# Patient Record
Sex: Female | Born: 1980 | Race: Asian | Hispanic: No | Marital: Married | Smoking: Never smoker
Health system: Southern US, Community
[De-identification: ages and names within clinical notes are randomized; demographics above are authoritative.]

## PROBLEM LIST (undated history)

## (undated) DIAGNOSIS — G40909 Epilepsy, unspecified, not intractable, without status epilepticus: Secondary | ICD-10-CM

## (undated) HISTORY — DX: Epilepsy, unspecified, not intractable, without status epilepticus: G40.909

---

## 2006-04-26 DIAGNOSIS — G40909 Epilepsy, unspecified, not intractable, without status epilepticus: Secondary | ICD-10-CM

## 2006-04-26 HISTORY — DX: Epilepsy, unspecified, not intractable, without status epilepticus: G40.909

## 2007-08-11 ENCOUNTER — Emergency Department (HOSPITAL_COMMUNITY): Admission: EM | Admit: 2007-08-11 | Discharge: 2007-08-11 | Payer: Self-pay | Admitting: Emergency Medicine

## 2007-08-31 ENCOUNTER — Ambulatory Visit: Payer: Self-pay | Admitting: Nurse Practitioner

## 2007-08-31 DIAGNOSIS — K644 Residual hemorrhoidal skin tags: Secondary | ICD-10-CM | POA: Insufficient documentation

## 2007-08-31 DIAGNOSIS — R569 Unspecified convulsions: Secondary | ICD-10-CM | POA: Insufficient documentation

## 2007-08-31 LAB — CONVERTED CEMR LAB
Bilirubin Urine: NEGATIVE
Blood in Urine, dipstick: NEGATIVE
Glucose, Urine, Semiquant: NEGATIVE
Protein, U semiquant: NEGATIVE
Urobilinogen, UA: 0.2
pH: 7

## 2007-09-01 LAB — CONVERTED CEMR LAB
BUN: 10 mg/dL (ref 6–23)
CO2: 24 meq/L (ref 19–32)
Creatinine, Ser: 0.62 mg/dL (ref 0.40–1.20)
Eosinophils Absolute: 0.1 10*3/uL (ref 0.0–0.7)
Eosinophils Relative: 1 % (ref 0–5)
Glucose, Bld: 70 mg/dL (ref 70–99)
HCT: 41 % (ref 36.0–46.0)
Hemoglobin: 14.1 g/dL (ref 12.0–15.0)
Lymphocytes Relative: 36 % (ref 12–46)
Lymphs Abs: 2.2 10*3/uL (ref 0.7–4.0)
MCV: 83.8 fL (ref 78.0–100.0)
Monocytes Absolute: 0.5 10*3/uL (ref 0.1–1.0)
Monocytes Relative: 9 % (ref 3–12)
Phenytoin Lvl: 4.7 ug/mL — ABNORMAL LOW (ref 10.0–20.0)
RBC: 4.89 M/uL (ref 3.87–5.11)
Sodium: 140 meq/L (ref 135–145)
TSH: 2.58 microintl units/mL (ref 0.350–5.50)
Total Bilirubin: 0.4 mg/dL (ref 0.3–1.2)
Total Protein: 8 g/dL (ref 6.0–8.3)
WBC: 6 10*3/uL (ref 4.0–10.5)

## 2007-11-20 ENCOUNTER — Emergency Department (HOSPITAL_COMMUNITY): Admission: EM | Admit: 2007-11-20 | Discharge: 2007-11-20 | Payer: Self-pay | Admitting: Emergency Medicine

## 2008-01-02 ENCOUNTER — Ambulatory Visit (HOSPITAL_COMMUNITY): Admission: RE | Admit: 2008-01-02 | Discharge: 2008-01-02 | Payer: Self-pay | Admitting: Obstetrics & Gynecology

## 2008-01-15 ENCOUNTER — Ambulatory Visit: Payer: Self-pay | Admitting: Obstetrics & Gynecology

## 2008-02-05 ENCOUNTER — Encounter: Admission: RE | Admit: 2008-02-05 | Discharge: 2008-02-05 | Payer: Self-pay | Admitting: Obstetrics & Gynecology

## 2008-02-05 ENCOUNTER — Ambulatory Visit: Payer: Self-pay | Admitting: Obstetrics & Gynecology

## 2008-02-05 LAB — CONVERTED CEMR LAB
Hemoglobin: 12.5 g/dL (ref 12.0–15.0)
RDW: 12.9 % (ref 11.5–15.5)
WBC: 6.8 10*3/uL (ref 4.0–10.5)

## 2008-02-26 ENCOUNTER — Ambulatory Visit: Payer: Self-pay | Admitting: Obstetrics & Gynecology

## 2008-03-01 ENCOUNTER — Inpatient Hospital Stay (HOSPITAL_COMMUNITY): Admission: AD | Admit: 2008-03-01 | Discharge: 2008-03-02 | Payer: Self-pay | Admitting: Family Medicine

## 2008-03-01 ENCOUNTER — Ambulatory Visit: Payer: Self-pay | Admitting: Obstetrics and Gynecology

## 2008-03-11 ENCOUNTER — Ambulatory Visit: Payer: Self-pay | Admitting: Obstetrics & Gynecology

## 2008-03-11 LAB — CONVERTED CEMR LAB

## 2008-04-01 ENCOUNTER — Ambulatory Visit: Payer: Self-pay | Admitting: Obstetrics & Gynecology

## 2008-04-15 ENCOUNTER — Encounter: Payer: Self-pay | Admitting: Family

## 2008-04-15 ENCOUNTER — Ambulatory Visit: Payer: Self-pay | Admitting: Obstetrics & Gynecology

## 2008-04-15 LAB — CONVERTED CEMR LAB
AST: 18 units/L (ref 0–37)
Alkaline Phosphatase: 86 units/L (ref 39–117)
BUN: 9 mg/dL (ref 6–23)
Creatinine, Ser: 0.49 mg/dL (ref 0.40–1.20)
Potassium: 4.1 meq/L (ref 3.5–5.3)

## 2008-04-16 ENCOUNTER — Encounter: Payer: Self-pay | Admitting: Obstetrics & Gynecology

## 2008-04-16 LAB — CONVERTED CEMR LAB
Chlamydia, DNA Probe: NEGATIVE
GC Probe Amp, Genital: NEGATIVE

## 2008-04-22 ENCOUNTER — Ambulatory Visit: Payer: Self-pay | Admitting: Family Medicine

## 2008-04-25 ENCOUNTER — Inpatient Hospital Stay (HOSPITAL_COMMUNITY): Admission: AD | Admit: 2008-04-25 | Discharge: 2008-04-25 | Payer: Self-pay | Admitting: Family Medicine

## 2008-04-25 ENCOUNTER — Ambulatory Visit: Payer: Self-pay | Admitting: Obstetrics and Gynecology

## 2008-05-01 ENCOUNTER — Ambulatory Visit: Payer: Self-pay | Admitting: Family Medicine

## 2008-05-01 ENCOUNTER — Inpatient Hospital Stay (HOSPITAL_COMMUNITY): Admission: AD | Admit: 2008-05-01 | Discharge: 2008-05-05 | Payer: Self-pay | Admitting: Obstetrics & Gynecology

## 2008-05-01 ENCOUNTER — Encounter: Payer: Self-pay | Admitting: Family Medicine

## 2008-07-25 ENCOUNTER — Emergency Department (HOSPITAL_COMMUNITY): Admission: EM | Admit: 2008-07-25 | Discharge: 2008-07-25 | Payer: Self-pay | Admitting: Family Medicine

## 2010-03-23 ENCOUNTER — Emergency Department (HOSPITAL_COMMUNITY)
Admission: EM | Admit: 2010-03-23 | Discharge: 2010-03-24 | Payer: Self-pay | Source: Home / Self Care | Admitting: Emergency Medicine

## 2010-05-21 ENCOUNTER — Ambulatory Visit (HOSPITAL_COMMUNITY)
Admission: RE | Admit: 2010-05-21 | Discharge: 2010-05-21 | Payer: Self-pay | Source: Home / Self Care | Attending: Family Medicine | Admitting: Family Medicine

## 2010-05-21 ENCOUNTER — Encounter: Payer: Self-pay | Admitting: Family Medicine

## 2010-05-28 ENCOUNTER — Encounter: Payer: Self-pay | Admitting: Obstetrics & Gynecology

## 2010-05-28 ENCOUNTER — Other Ambulatory Visit: Payer: Self-pay

## 2010-05-28 DIAGNOSIS — O2690 Pregnancy related conditions, unspecified, unspecified trimester: Secondary | ICD-10-CM

## 2010-05-28 DIAGNOSIS — O34219 Maternal care for unspecified type scar from previous cesarean delivery: Secondary | ICD-10-CM

## 2010-05-28 DIAGNOSIS — O0930 Supervision of pregnancy with insufficient antenatal care, unspecified trimester: Secondary | ICD-10-CM

## 2010-05-28 LAB — POCT URINALYSIS DIPSTICK
Ketones, ur: NEGATIVE mg/dL
Protein, ur: NEGATIVE mg/dL
Specific Gravity, Urine: 1.025 (ref 1.005–1.030)
pH: 6.5 (ref 5.0–8.0)

## 2010-06-19 ENCOUNTER — Inpatient Hospital Stay (HOSPITAL_COMMUNITY)
Admission: AD | Admit: 2010-06-19 | Discharge: 2010-06-20 | Disposition: A | Discharge status: Home / Self Care | Payer: Medicaid Other | Source: Ambulatory Visit | Attending: Obstetrics & Gynecology | Admitting: Obstetrics & Gynecology

## 2010-06-19 DIAGNOSIS — B9689 Other specified bacterial agents as the cause of diseases classified elsewhere: Secondary | ICD-10-CM | POA: Insufficient documentation

## 2010-06-19 DIAGNOSIS — O2390 Unspecified genitourinary tract infection in pregnancy, unspecified trimester: Secondary | ICD-10-CM

## 2010-06-19 DIAGNOSIS — R109 Unspecified abdominal pain: Secondary | ICD-10-CM | POA: Insufficient documentation

## 2010-06-19 DIAGNOSIS — N760 Acute vaginitis: Secondary | ICD-10-CM | POA: Insufficient documentation

## 2010-06-19 DIAGNOSIS — A499 Bacterial infection, unspecified: Secondary | ICD-10-CM | POA: Insufficient documentation

## 2010-06-20 LAB — URINALYSIS, ROUTINE W REFLEX MICROSCOPIC
Bilirubin Urine: NEGATIVE
Hgb urine dipstick: NEGATIVE
Ketones, ur: NEGATIVE mg/dL
Urine Glucose, Fasting: NEGATIVE mg/dL
pH: 6.5 (ref 5.0–8.0)

## 2010-06-20 LAB — WET PREP, GENITAL: Yeast Wet Prep HPF POC: NONE SEEN

## 2010-06-20 LAB — URINE MICROSCOPIC-ADD ON

## 2010-06-21 LAB — URINE CULTURE: Culture: NO GROWTH

## 2010-06-25 ENCOUNTER — Other Ambulatory Visit: Payer: Self-pay

## 2010-06-25 ENCOUNTER — Encounter: Payer: Self-pay | Admitting: Family Medicine

## 2010-06-25 DIAGNOSIS — O0930 Supervision of pregnancy with insufficient antenatal care, unspecified trimester: Secondary | ICD-10-CM

## 2010-06-25 DIAGNOSIS — O2690 Pregnancy related conditions, unspecified, unspecified trimester: Secondary | ICD-10-CM

## 2010-06-25 LAB — POCT URINALYSIS DIPSTICK
Bilirubin Urine: NEGATIVE
Ketones, ur: NEGATIVE mg/dL
Protein, ur: NEGATIVE mg/dL

## 2010-07-07 LAB — URINALYSIS, ROUTINE W REFLEX MICROSCOPIC
Ketones, ur: NEGATIVE mg/dL
Nitrite: NEGATIVE
Urobilinogen, UA: 0.2 mg/dL (ref 0.0–1.0)
pH: 7.5 (ref 5.0–8.0)

## 2010-07-17 ENCOUNTER — Inpatient Hospital Stay (INDEPENDENT_AMBULATORY_CARE_PROVIDER_SITE_OTHER)
Admission: RE | Admit: 2010-07-17 | Discharge: 2010-07-17 | Disposition: A | Discharge status: Home / Self Care | Payer: Medicaid Other | Source: Ambulatory Visit | Attending: Family Medicine | Admitting: Family Medicine

## 2010-07-17 DIAGNOSIS — M545 Low back pain: Secondary | ICD-10-CM

## 2010-07-17 LAB — POCT URINALYSIS DIP (DEVICE)
Bilirubin Urine: NEGATIVE
Nitrite: NEGATIVE
Protein, ur: NEGATIVE mg/dL
Urobilinogen, UA: 0.2 mg/dL (ref 0.0–1.0)
pH: 5.5 (ref 5.0–8.0)

## 2010-07-18 LAB — URINE CULTURE
Colony Count: 10000
Culture  Setup Time: 201203231721

## 2010-07-23 ENCOUNTER — Encounter: Payer: Self-pay | Admitting: Family Medicine

## 2010-07-23 ENCOUNTER — Other Ambulatory Visit: Payer: Self-pay | Admitting: Family Medicine

## 2010-07-23 DIAGNOSIS — Z331 Pregnant state, incidental: Secondary | ICD-10-CM

## 2010-07-23 DIAGNOSIS — O2690 Pregnancy related conditions, unspecified, unspecified trimester: Secondary | ICD-10-CM

## 2010-07-23 LAB — POCT URINALYSIS DIP (DEVICE)
Bilirubin Urine: NEGATIVE
Glucose, UA: NEGATIVE mg/dL
Hgb urine dipstick: NEGATIVE
Ketones, ur: NEGATIVE mg/dL
Specific Gravity, Urine: 1.025 (ref 1.005–1.030)

## 2010-07-23 LAB — CONVERTED CEMR LAB
Hemoglobin: 12.7 g/dL (ref 12.0–15.0)
MCHC: 33 g/dL (ref 30.0–36.0)
MCV: 85.4 fL (ref 78.0–100.0)
RBC: 4.51 M/uL (ref 3.87–5.11)
WBC: 7.8 10*3/uL (ref 4.0–10.5)

## 2010-08-10 ENCOUNTER — Other Ambulatory Visit: Payer: Self-pay | Admitting: Family Medicine

## 2010-08-10 DIAGNOSIS — O2690 Pregnancy related conditions, unspecified, unspecified trimester: Secondary | ICD-10-CM

## 2010-08-10 DIAGNOSIS — Z331 Pregnant state, incidental: Secondary | ICD-10-CM

## 2010-08-10 DIAGNOSIS — O0930 Supervision of pregnancy with insufficient antenatal care, unspecified trimester: Secondary | ICD-10-CM

## 2010-08-10 DIAGNOSIS — O34219 Maternal care for unspecified type scar from previous cesarean delivery: Secondary | ICD-10-CM

## 2010-08-10 LAB — POCT URINALYSIS DIP (DEVICE)
Glucose, UA: NEGATIVE mg/dL
Hgb urine dipstick: NEGATIVE
Nitrite: NEGATIVE
Protein, ur: 30 mg/dL — AB
Specific Gravity, Urine: 1.025 (ref 1.005–1.030)
Urobilinogen, UA: 0.2 mg/dL (ref 0.0–1.0)

## 2010-08-10 LAB — CBC
HCT: 27.8 % — ABNORMAL LOW (ref 36.0–46.0)
HCT: 36.1 % (ref 36.0–46.0)
Hemoglobin: 12.1 g/dL (ref 12.0–15.0)
MCHC: 33.6 g/dL (ref 30.0–36.0)
MCHC: 34 g/dL (ref 30.0–36.0)
MCV: 84.6 fL (ref 78.0–100.0)
Platelets: 119 10*3/uL — ABNORMAL LOW (ref 150–400)
RBC: 3.29 MIL/uL — ABNORMAL LOW (ref 3.87–5.11)
RBC: 4.28 MIL/uL (ref 3.87–5.11)
RDW: 14.7 % (ref 11.5–15.5)

## 2010-08-24 ENCOUNTER — Other Ambulatory Visit: Payer: Self-pay | Admitting: Obstetrics & Gynecology

## 2010-08-24 DIAGNOSIS — O0930 Supervision of pregnancy with insufficient antenatal care, unspecified trimester: Secondary | ICD-10-CM

## 2010-08-24 DIAGNOSIS — O34219 Maternal care for unspecified type scar from previous cesarean delivery: Secondary | ICD-10-CM

## 2010-08-24 DIAGNOSIS — O9989 Other specified diseases and conditions complicating pregnancy, childbirth and the puerperium: Secondary | ICD-10-CM

## 2010-08-24 DIAGNOSIS — Z331 Pregnant state, incidental: Secondary | ICD-10-CM

## 2010-08-24 LAB — POCT URINALYSIS DIP (DEVICE)
Bilirubin Urine: NEGATIVE
Hgb urine dipstick: NEGATIVE
Nitrite: NEGATIVE
Urobilinogen, UA: 0.2 mg/dL (ref 0.0–1.0)
pH: 7 (ref 5.0–8.0)

## 2010-09-07 ENCOUNTER — Inpatient Hospital Stay (HOSPITAL_COMMUNITY)
Admission: AD | Admit: 2010-09-07 | Discharge: 2010-09-07 | Disposition: A | Discharge status: Home / Self Care | Payer: Medicaid Other | Source: Ambulatory Visit | Attending: Family Medicine | Admitting: Family Medicine

## 2010-09-07 ENCOUNTER — Other Ambulatory Visit: Payer: Self-pay | Admitting: Obstetrics & Gynecology

## 2010-09-07 DIAGNOSIS — O99350 Diseases of the nervous system complicating pregnancy, unspecified trimester: Secondary | ICD-10-CM

## 2010-09-07 DIAGNOSIS — D689 Coagulation defect, unspecified: Secondary | ICD-10-CM | POA: Insufficient documentation

## 2010-09-07 DIAGNOSIS — O2650 Maternal hypotension syndrome, unspecified trimester: Secondary | ICD-10-CM | POA: Insufficient documentation

## 2010-09-07 DIAGNOSIS — G40909 Epilepsy, unspecified, not intractable, without status epilepticus: Secondary | ICD-10-CM

## 2010-09-07 DIAGNOSIS — O99119 Other diseases of the blood and blood-forming organs and certain disorders involving the immune mechanism complicating pregnancy, unspecified trimester: Secondary | ICD-10-CM

## 2010-09-07 DIAGNOSIS — O34219 Maternal care for unspecified type scar from previous cesarean delivery: Secondary | ICD-10-CM

## 2010-09-07 DIAGNOSIS — O0930 Supervision of pregnancy with insufficient antenatal care, unspecified trimester: Secondary | ICD-10-CM

## 2010-09-07 DIAGNOSIS — D696 Thrombocytopenia, unspecified: Secondary | ICD-10-CM | POA: Insufficient documentation

## 2010-09-07 DIAGNOSIS — O2690 Pregnancy related conditions, unspecified, unspecified trimester: Secondary | ICD-10-CM

## 2010-09-07 DIAGNOSIS — Z331 Pregnant state, incidental: Secondary | ICD-10-CM

## 2010-09-07 LAB — URINALYSIS, ROUTINE W REFLEX MICROSCOPIC
Hgb urine dipstick: NEGATIVE
Nitrite: NEGATIVE
Protein, ur: NEGATIVE mg/dL
Specific Gravity, Urine: >1.03 — ABNORMAL HIGH (ref 1.005–1.030)
Urobilinogen, UA: 1 mg/dL (ref 0.0–1.0)

## 2010-09-07 LAB — POCT URINALYSIS DIP (DEVICE)
Ketones, ur: NEGATIVE mg/dL
Nitrite: NEGATIVE
Protein, ur: 30 mg/dL — AB
Urobilinogen, UA: 0.2 mg/dL (ref 0.0–1.0)
pH: 6 (ref 5.0–8.0)

## 2010-09-07 LAB — URINE MICROSCOPIC-ADD ON

## 2010-09-07 LAB — BASIC METABOLIC PANEL
Chloride: 103 mEq/L (ref 96–112)
Potassium: 3.3 mEq/L — ABNORMAL LOW (ref 3.5–5.1)

## 2010-09-07 LAB — CBC
HCT: 36 % (ref 36.0–46.0)
MCHC: 33.1 g/dL (ref 30.0–36.0)
Platelets: 143 10*3/uL — ABNORMAL LOW (ref 150–400)
RDW: 13.2 % (ref 11.5–15.5)
WBC: 8.8 10*3/uL (ref 4.0–10.5)

## 2010-09-08 NOTE — Op Note (Signed)
NAMEDEANDRIA, KLUTE                ACCOUNT NO.:  [id number]   MEDICAL RECORD NO.:  [id number]          PATIENT TYPE:  INP   LOCATION:  9121                          FACILITY:  [facility]   PHYSICIAN:  Tanya S. Shawnie Pons, M.D.   DATE OF BIRTH:  06/15/1980   DATE OF PROCEDURE:  05/02/2008  DATE OF DISCHARGE:                               OPERATIVE REPORT   PREOPERATIVE DIAGNOSES:  1. Fetal intolerance of labor.  2. Intrauterine pregnancy at term.  3. Spontaneous rupture of membranes greater than 30 hours.  4. Chorioamnionitis.   POSTOPERATIVE DIAGNOSES:  1. Fetal intolerance of labor.  2. Intrauterine pregnancy at term.  3. Spontaneous rupture of membranes greater than 30 hours.  4. Chorioamnionitis.  5. Loose nuchal cord.   PROCEDURE:  Primary low transverse cesarean section.   SURGEON:  Shelbie Proctor. Shawnie Pons, MD   ASSISTANT:  Odie Sera, DO   ANESTHESIA:  Epidural and local.   REASON FOR PROCEDURE:  Ms. Brossard is a 30 year old gravida 1, now para  1, who presented at 8 weeks' gestational age with spontaneous rupture  of membranes.  She initially was 1-cm dilated and her labor progressed  quite slowly over a period of 30 hours.  The patient dilated to 4 cm.  However, was inadequately contracting.  Despite the best efforts to get  her to be adequately contracting, the baby had recurrent late  decelerations on the fetal heart tracing.  Multiple attempts were made  to try to progress to labor; however, the nonreassuring fetal heart  tracing persisted.  The patient was then counseled on the risks and  benefits of cesarean section to include but not limited to bleeding,  infection, damage to internal organs which may require further surgery.  The patient was counseled by using a language line.  The patient voiced  understanding of the risks and desired to proceed for cesarean section.   DESCRIPTION OF PROCEDURE:  The patient was taken to the operating room  where her epidural was redosed.   The patient was then prepped and draped  in usual sterile manner.  A time-out was conducted.  The appropriate  anesthesia was confirmed.  The patient was placed in the left dorsal  supine position.  A Pfannenstiel incision was made in the abdomen and  extended down to the fascial layer.  The fascia was incised in the  midline.  The fascial incision was extended using manual traction.  The  rectus muscles were separated manually and the peritoneum was entered  bluntly with the finger.  The peritoneal opening was then extended again  with manual traction.  Appropriate open to the uterus was obtained.  The  bladder blade was placed.  A transverse incision was made in the lower  uterine segment with a scalpel and continued through the myometrial  layers.  The final layer was entered bluntly with the finger.  The  uterine incision was then extended laterally with manual traction.  The  fetus was found to be in occiput transverse position.  The fetal head  was grasped.  The bladder blade was removed.  The fetal head was  delivered through the uterine incision with the assistance of fundal  pressure.  The mouth and nares were bulb suctioned.  Then, the shoulders  were delivered followed by the rest of the corpus.  The mouth and nares  were bulb suctioned again.  The cord was then clamped and cut and the  baby was handed to the awaiting NICU staff.  The baby did have  spontaneous cry and good tone.  The baby's Apgars were 8 and 8.  Weight  6 pounds and 7 ounces.  The placenta then delivered intact with the  assistance of fundal massage.  A cord blood gas was collected which had  a pH of 7.34.  The uterus was then cleared of debris and clots using a  dry lap sponge.  The uterine incision was closed using a 2-layer closure  with 3-0 Vicryl in the usual manner.  Good hemostasis was noted.  The  fascia was then closed using 0 Vicryl in a running noninterlocking  fashion.  The subcutaneous tissues were  irrigated with a moist lap  sponge.  Good hemostasis was noted.  There were no defects noted in the  fascial closure.  The skin was then closed using 4-0 Vicryl in a  subcuticular manner.  Steri-Strips were applied.  The upper and lower  aspects of the incision were then injected with 10 mL each of 0.25%  Marcaine without epinephrine.  The same solution was injected 5 mL at  each anterior superior iliac spine.   FINDINGS:  Viable female infant with Apgars 8 and 8, weight 6 pounds 7  ounces and a cord pH of 7.34.   SPECIMENS:  Placenta.   DISPOSITION:  To Labor and Delivery.   ESTIMATED BLOOD LOSS:  800 mL.   No immediate complications.  The patient was taken to PACU in good  condition.      Odie Sera, DO  Electronically Signed     ______________________________  Shelbie Proctor. Shawnie Pons, M.D.    MC/MEDQ  D:  05/02/2008  T:  05/03/2008  Job:  [id number]

## 2010-09-08 NOTE — Discharge Summary (Signed)
NAMESUMIE, REMSEN                ACCOUNT NO.:  [id number]   MEDICAL RECORD NO.:  [id number]          PATIENT TYPE:  INP   LOCATION:  9121                          [facility]ILITY[facility]  [facility]   PHYSICIAN:  Tanya S. Shawnie Pons, M.D.   DATE OF BIRTH:  1981-01-30   DATE OF ADMISSION:  05/01/2008  DATE OF DISCHARGE:  05/05/2008                               DISCHARGE SUMMARY   REASON FOR ADMISSION:  Pregnancy at term and labor.   DISCHARGE DIAGNOSES:  Pregnancy at term, delivered via cesarean section  secondary to fetal intolerance of labor and chorioamnionitis.   POSTOPERATIVE DIAGNOSES:  Pregnancy at term, delivered via cesarean  section secondary to fetal intolerance of labor and chorioamnionitis.   PROCEDURE:  The patient had a first-degree low transverse cesarean  section by Dr. Shawnie Pons and Dr. Noel Gerold, had a viable female infant with  Apgars of 8 and 8, birth weight was 6 pounds 7 ounces and a cord pH of  7.34.  The patient has progressed well during her postop.  She is taking  p.o. fluids and solids well.  She is up ambulating.  She is passing gas  rectally.  Her vital signs have remained stable.  Her hemoglobin today  is 9.4 and hematocrit 28.   PHYSICAL EXAMINATION:  VITAL SIGNS:  Today, vital signs are stable.  HEART:  Regular to rhythm and rate.  LUNGS:  Clear to auscultation bilaterally.  ABDOMEN:  Soft.  Bowel sounds are present in all 4 quadrants.  Fundus is  firm at -2 U.  Incision is intact.  There is no redness, swelling, or  drainage.  Lochia, scant amount.  EXTREMITIES:  There is no edema in the lower extremities.   MEDICATIONS:  Discharge medications are as follows:  1. Continue her prenatal vitamins, Chromagen Forte 1 p.o. b.i.d.  2. Motrin 600 p.o. q.6 h. p.r.n. cramping.  3. Percocet 5/325 one p.o. q.4 h. p.r.n. pain.   She is going to use Depo-Provera as her contraceptive.  She will get  that 150 mg IM prior to discharge.  She notes if she has any problems,  she should call  back to the hospital or come back to [facility]U.      Zerita Boers, N.M.      Shelbie Proctor. Shawnie Pons, M.D.  Electronically Signed    DL/MEDQ  D:  16/01/9603  T:  05/06/2008  Job:  [id number]   cc:   Shelbie Proctor. Shawnie Pons, M.D.

## 2010-09-14 ENCOUNTER — Other Ambulatory Visit: Payer: Self-pay | Admitting: Family Medicine

## 2010-09-14 ENCOUNTER — Other Ambulatory Visit: Payer: Self-pay | Admitting: Obstetrics and Gynecology

## 2010-09-14 DIAGNOSIS — O0930 Supervision of pregnancy with insufficient antenatal care, unspecified trimester: Secondary | ICD-10-CM

## 2010-09-14 DIAGNOSIS — O34219 Maternal care for unspecified type scar from previous cesarean delivery: Secondary | ICD-10-CM

## 2010-09-14 DIAGNOSIS — O2690 Pregnancy related conditions, unspecified, unspecified trimester: Secondary | ICD-10-CM

## 2010-09-14 DIAGNOSIS — Z331 Pregnant state, incidental: Secondary | ICD-10-CM

## 2010-09-14 LAB — POCT URINALYSIS DIP (DEVICE)
Bilirubin Urine: NEGATIVE
Glucose, UA: NEGATIVE mg/dL
Hgb urine dipstick: NEGATIVE
Specific Gravity, Urine: >=1.03 (ref 1.005–1.030)

## 2010-09-17 ENCOUNTER — Ambulatory Visit (HOSPITAL_COMMUNITY)
Admission: RE | Admit: 2010-09-17 | Discharge: 2010-09-17 | Disposition: A | Discharge status: Home / Self Care | Payer: Medicaid Other | Source: Ambulatory Visit | Attending: Family Medicine | Admitting: Family Medicine

## 2010-09-17 DIAGNOSIS — Z3689 Encounter for other specified antenatal screening: Secondary | ICD-10-CM | POA: Insufficient documentation

## 2010-09-17 DIAGNOSIS — O365990 Maternal care for other known or suspected poor fetal growth, unspecified trimester, not applicable or unspecified: Secondary | ICD-10-CM | POA: Insufficient documentation

## 2010-09-21 ENCOUNTER — Inpatient Hospital Stay (HOSPITAL_COMMUNITY)
Admission: AD | Admit: 2010-09-21 | Discharge: 2010-09-21 | Disposition: A | Discharge status: Home / Self Care | Payer: Medicaid Other | Source: Ambulatory Visit | Attending: Obstetrics and Gynecology | Admitting: Obstetrics and Gynecology

## 2010-09-21 DIAGNOSIS — K219 Gastro-esophageal reflux disease without esophagitis: Secondary | ICD-10-CM | POA: Insufficient documentation

## 2010-09-21 DIAGNOSIS — R109 Unspecified abdominal pain: Secondary | ICD-10-CM | POA: Insufficient documentation

## 2010-09-21 DIAGNOSIS — O99891 Other specified diseases and conditions complicating pregnancy: Secondary | ICD-10-CM | POA: Insufficient documentation

## 2010-09-21 DIAGNOSIS — O9989 Other specified diseases and conditions complicating pregnancy, childbirth and the puerperium: Secondary | ICD-10-CM

## 2010-09-24 ENCOUNTER — Other Ambulatory Visit: Payer: Self-pay | Admitting: Obstetrics and Gynecology

## 2010-09-24 DIAGNOSIS — O0930 Supervision of pregnancy with insufficient antenatal care, unspecified trimester: Secondary | ICD-10-CM

## 2010-09-24 DIAGNOSIS — O34219 Maternal care for unspecified type scar from previous cesarean delivery: Secondary | ICD-10-CM

## 2010-09-24 DIAGNOSIS — O2690 Pregnancy related conditions, unspecified, unspecified trimester: Secondary | ICD-10-CM

## 2010-09-24 LAB — POCT URINALYSIS DIP (DEVICE)
Bilirubin Urine: NEGATIVE
Hgb urine dipstick: NEGATIVE
Ketones, ur: NEGATIVE mg/dL
Specific Gravity, Urine: 1.02 (ref 1.005–1.030)
pH: 7 (ref 5.0–8.0)

## 2010-09-28 ENCOUNTER — Other Ambulatory Visit: Payer: Self-pay | Admitting: Obstetrics & Gynecology

## 2010-09-28 DIAGNOSIS — Z331 Pregnant state, incidental: Secondary | ICD-10-CM

## 2010-09-28 DIAGNOSIS — O2690 Pregnancy related conditions, unspecified, unspecified trimester: Secondary | ICD-10-CM

## 2010-09-28 LAB — POCT URINALYSIS DIP (DEVICE)
Ketones, ur: NEGATIVE mg/dL
Protein, ur: NEGATIVE mg/dL
Specific Gravity, Urine: 1.025 (ref 1.005–1.030)
pH: 5.5 (ref 5.0–8.0)

## 2010-10-05 ENCOUNTER — Other Ambulatory Visit: Payer: Self-pay | Admitting: Obstetrics & Gynecology

## 2010-10-05 DIAGNOSIS — O0930 Supervision of pregnancy with insufficient antenatal care, unspecified trimester: Secondary | ICD-10-CM

## 2010-10-05 DIAGNOSIS — O2690 Pregnancy related conditions, unspecified, unspecified trimester: Secondary | ICD-10-CM

## 2010-10-05 DIAGNOSIS — O34219 Maternal care for unspecified type scar from previous cesarean delivery: Secondary | ICD-10-CM

## 2010-10-05 LAB — POCT URINALYSIS DIP (DEVICE)
Glucose, UA: NEGATIVE mg/dL
Hgb urine dipstick: NEGATIVE
Specific Gravity, Urine: 1.025 (ref 1.005–1.030)
Urobilinogen, UA: 0.2 mg/dL (ref 0.0–1.0)

## 2010-10-12 ENCOUNTER — Inpatient Hospital Stay (HOSPITAL_COMMUNITY)
Admission: AD | Admit: 2010-10-12 | Discharge: 2010-10-12 | Disposition: A | Discharge status: Home / Self Care | Payer: Medicaid Other | Source: Ambulatory Visit | Attending: Family Medicine | Admitting: Family Medicine

## 2010-10-12 ENCOUNTER — Inpatient Hospital Stay (HOSPITAL_COMMUNITY)
Admission: AD | Admit: 2010-10-12 | Discharge: 2010-10-12 | Disposition: A | Discharge status: Home / Self Care | Payer: Medicaid Other | Source: Ambulatory Visit | Attending: Obstetrics and Gynecology | Admitting: Obstetrics and Gynecology

## 2010-10-12 DIAGNOSIS — O479 False labor, unspecified: Secondary | ICD-10-CM | POA: Insufficient documentation

## 2010-10-12 LAB — URINALYSIS, ROUTINE W REFLEX MICROSCOPIC
Glucose, UA: NEGATIVE mg/dL
Hgb urine dipstick: NEGATIVE
Protein, ur: 30 mg/dL — AB

## 2010-10-12 LAB — URINE MICROSCOPIC-ADD ON

## 2010-10-12 LAB — AMNISURE RUPTURE OF MEMBRANE (ROM) NOT AT ARMC: Amnisure ROM: NEGATIVE

## 2010-10-13 ENCOUNTER — Inpatient Hospital Stay (HOSPITAL_COMMUNITY)
Admission: AD | Admit: 2010-10-13 | Discharge: 2010-10-15 | DRG: 775 | Disposition: A | Discharge status: Home / Self Care | Payer: Medicaid Other | Source: Ambulatory Visit | Attending: Family Medicine | Admitting: Family Medicine

## 2010-10-13 LAB — CBC
MCV: 81.1 fL (ref 78.0–100.0)
Platelets: 126 10*3/uL — ABNORMAL LOW (ref 150–400)
RBC: 4.44 MIL/uL (ref 3.87–5.11)
WBC: 13.8 10*3/uL — ABNORMAL HIGH (ref 4.0–10.5)

## 2010-10-13 LAB — RPR: RPR Ser Ql: NONREACTIVE

## 2010-10-20 ENCOUNTER — Inpatient Hospital Stay (HOSPITAL_COMMUNITY): Admission: AD | Admit: 2010-10-20 | Payer: Self-pay | Source: Home / Self Care | Admitting: Obstetrics & Gynecology

## 2011-01-25 LAB — POCT URINALYSIS DIP (DEVICE)
Bilirubin Urine: NEGATIVE
Glucose, UA: NEGATIVE
Nitrite: NEGATIVE
Operator id: 194561

## 2011-01-26 LAB — POCT URINALYSIS DIP (DEVICE)
Bilirubin Urine: NEGATIVE
Bilirubin Urine: NEGATIVE
Glucose, UA: NEGATIVE
Glucose, UA: NEGATIVE
Glucose, UA: NEGATIVE
Nitrite: NEGATIVE
Nitrite: NEGATIVE
Operator id: 194561
Operator id: 287931
Protein, ur: 100 — AB
Specific Gravity, Urine: 1.025
Urobilinogen, UA: 0.2
Urobilinogen, UA: 0.2
Urobilinogen, UA: 0.2

## 2011-01-26 LAB — CBC
MCHC: 34.4
Platelets: 165
RBC: 4.1
WBC: 9.1

## 2011-01-29 LAB — POCT URINALYSIS DIP (DEVICE)
Bilirubin Urine: NEGATIVE
Bilirubin Urine: NEGATIVE
Bilirubin Urine: NEGATIVE
Glucose, UA: 100 mg/dL — AB
Glucose, UA: NEGATIVE mg/dL
Glucose, UA: NEGATIVE mg/dL
Hgb urine dipstick: NEGATIVE
Hgb urine dipstick: NEGATIVE
Ketones, ur: NEGATIVE mg/dL
Nitrite: NEGATIVE
Nitrite: NEGATIVE
Nitrite: NEGATIVE
Urobilinogen, UA: 0.2 mg/dL (ref 0.0–1.0)
Urobilinogen, UA: 0.2 mg/dL (ref 0.0–1.0)
pH: 5.5 (ref 5.0–8.0)

## 2011-01-29 LAB — GLUCOSE, CAPILLARY: Glucose-Capillary: 98 mg/dL (ref 70–99)

## 2014-12-22 ENCOUNTER — Inpatient Hospital Stay (HOSPITAL_COMMUNITY)
Admission: EM | Admit: 2014-12-22 | Discharge: 2014-12-26 | DRG: 917 | Disposition: A | Discharge status: Home / Self Care | Payer: BLUE CROSS/BLUE SHIELD | Attending: Internal Medicine | Admitting: Internal Medicine

## 2014-12-22 ENCOUNTER — Inpatient Hospital Stay (HOSPITAL_COMMUNITY): Payer: BLUE CROSS/BLUE SHIELD

## 2014-12-22 ENCOUNTER — Emergency Department (HOSPITAL_COMMUNITY): Payer: BLUE CROSS/BLUE SHIELD

## 2014-12-22 ENCOUNTER — Encounter (HOSPITAL_COMMUNITY): Payer: Self-pay | Admitting: Emergency Medicine

## 2014-12-22 DIAGNOSIS — R0682 Tachypnea, not elsewhere classified: Secondary | ICD-10-CM | POA: Diagnosis present

## 2014-12-22 DIAGNOSIS — A419 Sepsis, unspecified organism: Secondary | ICD-10-CM | POA: Diagnosis present

## 2014-12-22 DIAGNOSIS — T1491 Suicide attempt: Secondary | ICD-10-CM | POA: Diagnosis not present

## 2014-12-22 DIAGNOSIS — T5491XA Toxic effect of unspecified corrosive substance, accidental (unintentional), initial encounter: Secondary | ICD-10-CM | POA: Diagnosis not present

## 2014-12-22 DIAGNOSIS — T600X2A Toxic effect of organophosphate and carbamate insecticides, intentional self-harm, initial encounter: Secondary | ICD-10-CM | POA: Diagnosis not present

## 2014-12-22 DIAGNOSIS — T6092XA Toxic effect of unspecified pesticide, intentional self-harm, initial encounter: Principal | ICD-10-CM | POA: Diagnosis present

## 2014-12-22 DIAGNOSIS — E876 Hypokalemia: Secondary | ICD-10-CM | POA: Diagnosis present

## 2014-12-22 DIAGNOSIS — E872 Acidosis, unspecified: Secondary | ICD-10-CM

## 2014-12-22 DIAGNOSIS — G40909 Epilepsy, unspecified, not intractable, without status epilepticus: Secondary | ICD-10-CM | POA: Diagnosis present

## 2014-12-22 DIAGNOSIS — G9340 Encephalopathy, unspecified: Secondary | ICD-10-CM

## 2014-12-22 DIAGNOSIS — K2900 Acute gastritis without bleeding: Secondary | ICD-10-CM | POA: Diagnosis present

## 2014-12-22 DIAGNOSIS — F329 Major depressive disorder, single episode, unspecified: Secondary | ICD-10-CM | POA: Diagnosis present

## 2014-12-22 DIAGNOSIS — R680 Hypothermia, not associated with low environmental temperature: Secondary | ICD-10-CM | POA: Diagnosis present

## 2014-12-22 DIAGNOSIS — R569 Unspecified convulsions: Secondary | ICD-10-CM | POA: Diagnosis not present

## 2014-12-22 DIAGNOSIS — Y92009 Unspecified place in unspecified non-institutional (private) residence as the place of occurrence of the external cause: Secondary | ICD-10-CM | POA: Diagnosis not present

## 2014-12-22 DIAGNOSIS — I959 Hypotension, unspecified: Secondary | ICD-10-CM | POA: Diagnosis present

## 2014-12-22 DIAGNOSIS — R0902 Hypoxemia: Secondary | ICD-10-CM

## 2014-12-22 DIAGNOSIS — T5494XA Toxic effect of unspecified corrosive substance, undetermined, initial encounter: Secondary | ICD-10-CM

## 2014-12-22 DIAGNOSIS — I9589 Other hypotension: Secondary | ICD-10-CM | POA: Diagnosis not present

## 2014-12-22 DIAGNOSIS — R6510 Systemic inflammatory response syndrome (SIRS) of non-infectious origin without acute organ dysfunction: Secondary | ICD-10-CM

## 2014-12-22 DIAGNOSIS — T1491XA Suicide attempt, initial encounter: Secondary | ICD-10-CM | POA: Diagnosis present

## 2014-12-22 DIAGNOSIS — R4182 Altered mental status, unspecified: Secondary | ICD-10-CM | POA: Diagnosis present

## 2014-12-22 LAB — URINALYSIS, ROUTINE W REFLEX MICROSCOPIC
BILIRUBIN URINE: NEGATIVE
GLUCOSE, UA: 250 mg/dL — AB
HGB URINE DIPSTICK: NEGATIVE
KETONES UR: NEGATIVE mg/dL
LEUKOCYTES UA: NEGATIVE
Nitrite: NEGATIVE
PROTEIN: NEGATIVE mg/dL
Specific Gravity, Urine: 1.021 (ref 1.005–1.030)
Urobilinogen, UA: 0.2 mg/dL (ref 0.0–1.0)
pH: 5 (ref 5.0–8.0)

## 2014-12-22 LAB — COMPREHENSIVE METABOLIC PANEL
ALT: 50 U/L (ref 14–54)
AST: 42 U/L — AB (ref 15–41)
Albumin: 5.1 g/dL — ABNORMAL HIGH (ref 3.5–5.0)
Alkaline Phosphatase: 68 U/L (ref 38–126)
Anion gap: 19 — ABNORMAL HIGH (ref 5–15)
BILIRUBIN TOTAL: 1.3 mg/dL — AB (ref 0.3–1.2)
BUN: 15 mg/dL (ref 6–20)
CO2: 17 mmol/L — ABNORMAL LOW (ref 22–32)
Calcium: 9.5 mg/dL (ref 8.9–10.3)
Chloride: 106 mmol/L (ref 101–111)
Creatinine, Ser: 0.73 mg/dL (ref 0.44–1.00)
GFR calc Af Amer: >60 mL/min (ref >60)
Glucose, Bld: 207 mg/dL — ABNORMAL HIGH (ref 65–99)
POTASSIUM: 2.6 mmol/L — AB (ref 3.5–5.1)
Sodium: 142 mmol/L (ref 135–145)
TOTAL PROTEIN: 8.6 g/dL — AB (ref 6.5–8.1)

## 2014-12-22 LAB — BLOOD GAS, ARTERIAL
ACID-BASE DEFICIT: 6.5 mmol/L — AB (ref 0.0–2.0)
Acid-base deficit: 6.5 mmol/L — ABNORMAL HIGH (ref 0.0–2.0)
BICARBONATE: 16.1 meq/L — AB (ref 20.0–24.0)
Bicarbonate: 15.4 mEq/L — ABNORMAL LOW (ref 20.0–24.0)
DRAWN BY: 295031
FIO2: 0.21
FIO2: 0.21
O2 SAT: 97.8 %
O2 SAT: 98.7 %
PATIENT TEMPERATURE: 98.6
PATIENT TEMPERATURE: 98.6
PH ART: 7.416 (ref 7.350–7.450)
PO2 ART: 176 mmHg — AB (ref 80.0–100.0)
TCO2: 14.2 mmol/L (ref 0–100)
TCO2: 13.6 mmol/L (ref 0–100)
pCO2 arterial: 25.6 mmHg — ABNORMAL LOW (ref 35.0–45.0)
pCO2 arterial: 22.6 mmHg — ABNORMAL LOW (ref 35.0–45.0)
pH, Arterial: 7.449 (ref 7.350–7.450)
pO2, Arterial: 119 mmHg — ABNORMAL HIGH (ref 80.0–100.0)

## 2014-12-22 LAB — PROCALCITONIN: PROCALCITONIN: 0.6 ng/mL

## 2014-12-22 LAB — CBC WITH DIFFERENTIAL/PLATELET
BASOS PCT: 0 % (ref 0–1)
Basophils Absolute: 0 10*3/uL (ref 0.0–0.1)
EOS PCT: 0 % (ref 0–5)
Eosinophils Absolute: 0 10*3/uL (ref 0.0–0.7)
HEMATOCRIT: 43.4 % (ref 36.0–46.0)
Hemoglobin: 15 g/dL (ref 12.0–15.0)
LYMPHS ABS: 3.8 10*3/uL (ref 0.7–4.0)
Lymphocytes Relative: 12 % (ref 12–46)
MCH: 28.5 pg (ref 26.0–34.0)
MCHC: 34.6 g/dL (ref 30.0–36.0)
MCV: 82.4 fL (ref 78.0–100.0)
MONOS PCT: 3 % (ref 3–12)
Monocytes Absolute: 0.9 10*3/uL (ref 0.1–1.0)
NEUTROS ABS: 26.6 10*3/uL — AB (ref 1.7–7.7)
Neutrophils Relative %: 85 % — ABNORMAL HIGH (ref 43–77)
Platelets: 163 10*3/uL (ref 150–400)
RBC: 5.27 MIL/uL — AB (ref 3.87–5.11)
RDW: 13.2 % (ref 11.5–15.5)
WBC: 31.3 10*3/uL — ABNORMAL HIGH (ref 4.0–10.5)

## 2014-12-22 LAB — ACETAMINOPHEN LEVEL: Acetaminophen (Tylenol), Serum: <10 ug/mL — ABNORMAL LOW (ref 10–30)

## 2014-12-22 LAB — CK TOTAL AND CKMB (NOT AT ARMC)
CK TOTAL: 62 U/L (ref 38–234)
CK, MB: 6.1 ng/mL — AB (ref 0.5–5.0)
RELATIVE INDEX: INVALID (ref 0.0–2.5)

## 2014-12-22 LAB — RAPID URINE DRUG SCREEN, HOSP PERFORMED
AMPHETAMINES: NOT DETECTED
BENZODIAZEPINES: NOT DETECTED
Barbiturates: NOT DETECTED
Cocaine: NOT DETECTED
OPIATES: NOT DETECTED
Tetrahydrocannabinol: NOT DETECTED

## 2014-12-22 LAB — I-STAT CG4 LACTIC ACID, ED: LACTIC ACID, VENOUS: 4.93 mmol/L — AB (ref 0.5–2.0)

## 2014-12-22 LAB — CORTISOL: Cortisol, Plasma: 39.5 ug/dL

## 2014-12-22 LAB — SALICYLATE LEVEL: Salicylate Lvl: <4 mg/dL (ref 2.8–30.0)

## 2014-12-22 LAB — AMYLASE
AMYLASE: 40 U/L (ref 28–100)
Amylase: 30 U/L (ref 28–100)

## 2014-12-22 LAB — MRSA PCR SCREENING: MRSA BY PCR: NEGATIVE

## 2014-12-22 LAB — LACTIC ACID, PLASMA
LACTIC ACID, VENOUS: 6.8 mmol/L — AB (ref 0.5–2.0)
LACTIC ACID, VENOUS: 1.4 mmol/L (ref 0.5–2.0)

## 2014-12-22 LAB — TROPONIN I: Troponin I: <0.03 ng/mL (ref <0.031)

## 2014-12-22 LAB — C DIFFICILE QUICK SCREEN W PCR REFLEX
C DIFFICILE (CDIFF) INTERP: NEGATIVE
C Diff antigen: NEGATIVE
C Diff toxin: NEGATIVE

## 2014-12-22 LAB — CBG MONITORING, ED: Glucose-Capillary: 187 mg/dL — ABNORMAL HIGH (ref 65–99)

## 2014-12-22 LAB — APTT: aPTT: 29 seconds (ref 24–37)

## 2014-12-22 LAB — PHOSPHORUS
Phosphorus: 2.5 mg/dL (ref 2.5–4.6)
Phosphorus: 1.4 mg/dL — ABNORMAL LOW (ref 2.5–4.6)

## 2014-12-22 LAB — STREP PNEUMONIAE URINARY ANTIGEN: Strep Pneumo Urinary Antigen: NEGATIVE

## 2014-12-22 LAB — PROTIME-INR
INR: 1.03 (ref 0.00–1.49)
Prothrombin Time: 13.7 seconds (ref 11.6–15.2)

## 2014-12-22 LAB — ETHANOL: Alcohol, Ethyl (B): <5 mg/dL (ref <5)

## 2014-12-22 LAB — POC URINE PREG, ED: Preg Test, Ur: NEGATIVE

## 2014-12-22 LAB — LIPASE, BLOOD
LIPASE: 17 U/L — AB (ref 22–51)
Lipase: 21 U/L — ABNORMAL LOW (ref 22–51)

## 2014-12-22 LAB — MAGNESIUM
MAGNESIUM: 2 mg/dL (ref 1.7–2.4)
Magnesium: 1.5 mg/dL — ABNORMAL LOW (ref 1.7–2.4)

## 2014-12-22 MED ORDER — ATROPINE SULFATE 1 MG/ML IJ SOLN
2.0000 mg | Freq: Once | INTRAMUSCULAR | Status: AC
  Administered 2014-12-22: 1 mg via INTRAVENOUS
  Filled 2014-12-22: qty 2

## 2014-12-22 MED ORDER — SODIUM CHLORIDE 0.9 % IV BOLUS (SEPSIS)
1000.0000 mL | Freq: Once | INTRAVENOUS | Status: AC
  Administered 2014-12-22: 1000 mL via INTRAVENOUS

## 2014-12-22 MED ORDER — PANTOPRAZOLE SODIUM 40 MG IV SOLR
40.0000 mg | Freq: Every day | INTRAVENOUS | Status: DC
  Administered 2014-12-22 – 2014-12-24 (×3): 40 mg via INTRAVENOUS
  Filled 2014-12-22 (×3): qty 40

## 2014-12-22 MED ORDER — SODIUM CHLORIDE 0.9 % IV SOLN
INTRAVENOUS | Status: DC
  Administered 2014-12-22: 19:00:00 via INTRAVENOUS

## 2014-12-22 MED ORDER — LEVETIRACETAM 500 MG/5ML IV SOLN
500.0000 mg | Freq: Two times a day (BID) | INTRAVENOUS | Status: DC
  Administered 2014-12-23 – 2014-12-25 (×5): 500 mg via INTRAVENOUS
  Filled 2014-12-22 (×7): qty 5

## 2014-12-22 MED ORDER — POTASSIUM CHLORIDE 10 MEQ/100ML IV SOLN
10.0000 meq | INTRAVENOUS | Status: AC
  Administered 2014-12-22 (×4): 10 meq via INTRAVENOUS
  Filled 2014-12-22 (×3): qty 100

## 2014-12-22 MED ORDER — SODIUM CHLORIDE 0.9 % IV SOLN
1.0000 g | Freq: Once | INTRAVENOUS | Status: AC
  Administered 2014-12-22: 1 g via INTRAVENOUS
  Filled 2014-12-22: qty 1

## 2014-12-22 MED ORDER — SODIUM CHLORIDE 0.9 % IV SOLN
3.0000 g | Freq: Four times a day (QID) | INTRAVENOUS | Status: DC
  Administered 2014-12-22 – 2014-12-24 (×7): 3 g via INTRAVENOUS
  Filled 2014-12-22 (×9): qty 3

## 2014-12-22 MED ORDER — SODIUM CHLORIDE 0.9 % IV SOLN
1000.0000 mg | Freq: Once | INTRAVENOUS | Status: AC
  Administered 2014-12-22: 1000 mg via INTRAVENOUS
  Filled 2014-12-22: qty 10

## 2014-12-22 MED ORDER — SODIUM CHLORIDE 0.9 % IV BOLUS (SEPSIS)
2000.0000 mL | Freq: Once | INTRAVENOUS | Status: AC
  Administered 2014-12-22: 2000 mL via INTRAVENOUS

## 2014-12-22 MED ORDER — SODIUM CHLORIDE 0.9 % IV SOLN
INTRAVENOUS | Status: DC
  Administered 2014-12-22 – 2014-12-25 (×8): via INTRAVENOUS
  Filled 2014-12-22 (×21): qty 250

## 2014-12-22 MED ORDER — SODIUM CHLORIDE 0.9 % IV SOLN: 1000.0000 mg | Freq: Two times a day (BID) | INTRAVENOUS | Status: DC

## 2014-12-22 MED ORDER — LIP MEDEX EX OINT
TOPICAL_OINTMENT | CUTANEOUS | Status: AC
  Administered 2014-12-22: 1
  Filled 2014-12-22: qty 7

## 2014-12-22 NOTE — ED Notes (Signed)
RT at bedside to obtain ABG.

## 2014-12-22 NOTE — Procedures (Signed)
Central Venous Catheter Insertion Procedure Note Roberta Hoover [id number] [birth year]  Procedure: Insertion of Central Venous Catheter Indications: Assessment of intravascular volume, Drug and/or fluid administration and Frequent blood sampling  Procedure Details Consent: Risks of procedure as well as the alternatives and risks of each were explained to the (patient/caregiver).  Consent for procedure obtained. and Unable to obtain consent because of altered level of consciousness. Time Out: Verified patient identification, verified procedure, site/side was marked, verified correct patient position, special equipment/implants available, medications/allergies/relevent history reviewed, required imaging and test results available.  Performed  Maximum sterile technique was used including antiseptics, cap, gloves, gown, hand hygiene, mask and sheet. Skin prep: Chlorhexidine; local anesthetic administered A antimicrobial bonded/coated triple lumen catheter was placed in the left internal jugular vein using the Seldinger technique. Ultrasound guidance used.Yes.   Catheter placed to 20 cm. Blood aspirated via all 3 ports and then flushed x 3. Line sutured x 2 and dressing applied.  Evaluation Blood flow good Complications: No apparent complications Patient did tolerate procedure well. Chest X-ray ordered to verify placement.  CXR: pending.  Brett Canales Linc Renne ACNP [facility] PCCM Pager [phone] till 3 pm If no answer page [phone] 12/22/2014, 4:53 PM

## 2014-12-22 NOTE — Progress Notes (Signed)
MEDICATION RELATED CONSULT NOTE - INITIAL   Pharmacy Consult for pralidoxime Indication: s/p ingestion acephate, an organophosphate  Allergies not on file  Patient Measurements:     Vital Signs: Temp: 97.7 F (36.5 C) (08/28 2000) Temp Source: Core (Comment) (08/28 2000) BP: 90/71 mmHg (08/28 2000) Pulse Rate: 115 (08/28 2000) I Labs:  Recent Labs  12/22/14 1403 12/22/14 1601 12/22/14 1830  WBC 31.3*  --   --   HGB 15.0  --   --   HCT 43.4  --   --   PLT 163  --   --   APTT  --  29  --   CREATININE 0.73  --   --   MG  --  2.0 1.5*  PHOS  --  2.5 1.4*  ALBUMIN 5.1*  --   --   PROT 8.6*  --   --   AST 42*  --   --   ALT 50  --   --   ALKPHOS 68  --   --   BILITOT 1.3*  --   --    CrCl cannot be calculated (Unknown ideal weight.).  Medical History: History reviewed. No pertinent past medical history.  Medications:  Scheduled:  . ampicillin-sulbactam (UNASYN) IV  3 g Intravenous Q6H  . atropine  2 mg Intravenous Once  . [START ON 12/23/2014] levETIRAcetam  500 mg Intravenous Q12H  . pantoprazole (PROTONIX) IV  40 mg Intravenous QHS  . potassium chloride  10 mEq Intravenous Q1 Hr x 4  . pralidoxime (PROTOPAM) IVPB  1 g Intravenous Once   Assessment:   Request to start 1 gm loading dose and continuous infusion at /hr  Plan:  Pralidoxime 1 gm/24ml ns load over 30 min per MD followed by continuous infusion starting at 250 mg/hr. Note:  2000 mg pralidoxime in 250 ml NS to give an 8 mg/ml concentration.  Starting dose 250 mg/hr per md  Charyl Dancer 12/22/2014,8:47 PM

## 2014-12-22 NOTE — ED Provider Notes (Signed)
CSN: [id number]     Arrival date & time 12/22/14  1337 History   First MD Initiated Contact with Patient 12/22/14 1342     Chief Complaint  Patient presents with  . Ingestion  . Suicidal   34 y/o woman brought to ED via EMS after ingesting a cockroach poison white powder at home in a suicide attempt at approximately 9:30am this morning (~4 hrs before arrival). She was initially brought by her husband to [facility] ED shortly after the incident but did not want medical attention and returned home. She later notified [facility] department via telephone that she was feeling ill and was found at the scene by EMS with decreased responsiveness.   History obtained from EMS staff, [employer], as patient is limited responsiveness. She is able to tell staff she wished to commit suicide and swallowed substance intentionally.  (Consider location/radiation/quality/duration/timing/severity/associated sxs/prior Treatment) The history is provided by the police, the patient and the spouse. The history is limited by a language barrier and the condition of the patient.    History reviewed. No pertinent past medical history. Past Surgical History  Procedure Laterality Date  . Cesarean section     Family History  Problem Relation Age of Onset  . Family history unknown: Yes   Social History  Substance Use Topics  . Smoking status: Never Smoker   . Smokeless tobacco: None  . Alcohol Use: No   OB History    No data available     Review of Systems  Unable to perform ROS   Allergies  Review of patient's allergies indicates no known allergies.  Home Medications   Prior to Admission medications   Not on File   BP 86/51 mmHg  Pulse 69  Temp(Src) 99.7 F (37.6 C) (Core (Comment))  Resp 15  Wt 150 lb 9.2 oz (68.3 kg)  SpO2 98%  LMP  (LMP Unknown) Physical Exam  Constitutional: She appears distressed.  Eyes: Conjunctivae are normal.  Pupils constricted, symmetric bilaterally   Cardiovascular: Regular rhythm and intact distal pulses.   tachycardic  Pulmonary/Chest: No stridor. She has rales.  Tachypnic, good air movement bilaterally  Abdominal: Soft.  Musculoskeletal: She exhibits no edema.  Neurological:  Answers some questions clearly when addressed, but waxing and waning level of alertness  Skin: She is diaphoretic.    ED Course  Procedures (including critical care time) Labs Review Labs Reviewed  CBC WITH DIFFERENTIAL/PLATELET - Abnormal; Notable for the following:    WBC 31.3 (*)    RBC 5.27 (*)    Neutrophils Relative % 85 (*)    Neutro Abs 26.6 (*)    All other components within normal limits  COMPREHENSIVE METABOLIC PANEL - Abnormal; Notable for the following:    Potassium 2.6 (*)    CO2 17 (*)    Glucose, Bld 207 (*)    Total Protein 8.6 (*)    Albumin 5.1 (*)    AST 42 (*)    Total Bilirubin 1.3 (*)    Anion gap 19 (*)    All other components within normal limits  URINALYSIS, ROUTINE W REFLEX MICROSCOPIC (NOT AT [facility]) - Abnormal; Notable for the following:    Glucose, UA 250 (*)    All other components within normal limits  BLOOD GAS, ARTERIAL - Abnormal; Notable for the following:    pCO2 arterial 25.6 (*)    pO2, Arterial 119 (*)    Bicarbonate 16.1 (*)    Acid-base deficit 6.5 (*)  All other components within normal limits  ACETAMINOPHEN LEVEL - Abnormal; Notable for the following:    Acetaminophen (Tylenol), Serum <10 (*)    All other components within normal limits  LACTIC ACID, PLASMA - Abnormal; Notable for the following:    Lactic Acid, Venous 6.8 (*)    All other components within normal limits  BLOOD GAS, ARTERIAL - Abnormal; Notable for the following:    pCO2 arterial 22.6 (*)    pO2, Arterial 176 (*)    Bicarbonate 15.4 (*)    Acid-base deficit 6.5 (*)    All other components within normal limits  LIPASE, BLOOD - Abnormal; Notable for the following:    Lipase 17 (*)    All other components within normal limits   TROPONIN I - Abnormal; Notable for the following:    Troponin I 0.06 (*)    All other components within normal limits  ACETAMINOPHEN LEVEL - Abnormal; Notable for the following:    Acetaminophen (Tylenol), Serum <10 (*)    All other components within normal limits  CK TOTAL AND CKMB (NOT AT [facility]) - Abnormal; Notable for the following:    CK, MB 6.1 (*)    All other components within normal limits  LIPASE, BLOOD - Abnormal; Notable for the following:    Lipase 21 (*)    All other components within normal limits  BASIC METABOLIC PANEL - Abnormal; Notable for the following:    Chloride 116 (*)    CO2 17 (*)    Glucose, Bld 101 (*)    Calcium 6.7 (*)    All other components within normal limits  MAGNESIUM - Abnormal; Notable for the following:    Magnesium 1.5 (*)    All other components within normal limits  PHOSPHORUS - Abnormal; Notable for the following:    Phosphorus 1.4 (*)    All other components within normal limits  CBC - Abnormal; Notable for the following:    WBC 14.0 (*)    Hemoglobin 10.7 (*)    HCT 32.4 (*)    Platelets 111 (*)    All other components within normal limits  BLOOD GAS, ARTERIAL - Abnormal; Notable for the following:    pH, Arterial 7.291 (*)    pO2, Arterial 166 (*)    Bicarbonate 16.3 (*)    Acid-base deficit 8.9 (*)    All other components within normal limits  LACTIC ACID, PLASMA - Abnormal; Notable for the following:    Lactic Acid, Venous 0.4 (*)    All other components within normal limits  CBG MONITORING, ED - Abnormal; Notable for the following:    Glucose-Capillary 187 (*)    All other components within normal limits  I-STAT CG4 LACTIC ACID, ED - Abnormal; Notable for the following:    Lactic Acid, Venous 4.93 (*)    All other components within normal limits  URINE CULTURE  CULTURE, BLOOD (ROUTINE X 2)  CULTURE, BLOOD (ROUTINE X 2)  URINE CULTURE  C DIFFICILE QUICK SCREEN W PCR REFLEX  MRSA PCR SCREENING  URINE CULTURE  URINE  RAPID DRUG SCREEN, HOSP PERFORMED  ETHANOL  SALICYLATE LEVEL  MAGNESIUM  PHOSPHORUS  PROCALCITONIN  CORTISOL  PROTIME-INR  APTT  STREP PNEUMONIAE URINARY ANTIGEN  LEGIONELLA ANTIGEN, URINE  AMYLASE  TROPONIN I  TROPONIN I  SALICYLATE LEVEL  PROCALCITONIN  AMYLASE  TROPONIN I  PROCALCITONIN  LACTIC ACID, PLASMA  GI PATHOGEN PANEL BY PCR, STOOL  POC URINE PREG, ED  I-STAT CG4 LACTIC  ACID, ED    Imaging Review Ct Head Wo Contrast  12/22/2014   CLINICAL DATA:  Altered mental status.  Ingested poison.  Suicidal.  EXAM: CT HEAD WITHOUT CONTRAST  TECHNIQUE: Contiguous axial images were obtained from the base of the skull through the vertex without intravenous contrast.  COMPARISON:  None.  FINDINGS: No acute intracranial abnormality. Specifically, no hemorrhage, hydrocephalus, mass lesion, acute infarction, or significant intracranial injury. No acute calvarial abnormality. Visualized paranasal sinuses and mastoids clear. Orbital soft tissues unremarkable.  IMPRESSION: Normal study.   Electronically Signed   By: Charlett Nose M.D.   On: 12/22/2014 17:12   Dg Chest Portable 1 View  12/22/2014   CLINICAL DATA:  Central line placement  EXAM: PORTABLE CHEST - 1 VIEW  COMPARISON:  Portable exam 1706 hours compared to 1404 hours  FINDINGS: Tip of LEFT jugular central venous catheter projects over RIGHT atrium; recommend withdrawal 3 cm to position tip at cavoatrial junction.  Normal heart size, mediastinal contours, and pulmonary vascularity.  Lungs clear.  No pleural effusion or pneumothorax.  Osseous structures unremarkable.  IMPRESSION: Tip of LEFT jugular central venous catheter projects over RIGHT atrium, recommend withdrawal 3 cm.  Findings called to Dr. Marchelle Gearing on 12/22/2014 at 1715 hours.   Electronically Signed   By: Ulyses Southward M.D.   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  Lungs clear.  No pleural effusion or pneumothorax.  Bones unremarkable.  IMPRESSION: No acute abnormalities.   Electronically Signed   By: Ulyses Southward M.D.   On: 12/22/2014 15:02   I have personally reviewed and evaluated these images and lab results as part of my medical decision-making.   EKG Interpretation   Date/Time:  Sunday December 22 2014 16:31:29 EDT Ventricular Rate:  115 PR Interval:  147 QRS Duration: 85 QT Interval:  328 QTC Calculation: 454 R Axis:   72 Text Interpretation:  Sinus tachycardia Consider right atrial enlargement  Borderline T wave abnormalities ED PHYSICIAN INTERPRETATION AVAILABLE IN  [facility] Confirmed by TEST, Record ([id number]) on 12/23/2014 7:08:54 AM      MDM   Final diagnoses:  Altered mental state  Hypoxemia    34 y/o woman with intentional toxic ingestion in a suicide attempt. Exact substance ingested was unknown at presentation, some form of pesticide. Symptoms worsened since time of ingestion 4 hours preceding, and too late for treatments aimed at preventing absorption through GI tract. On exam tachycardia, diaphoresis, tremors, miosis, are consistent with mild or moderate symptoms of organophosphate poisoning vs other agent causing a respiratory compensated metabolic acidosis. Vital signs abnormal but stable at secondary evaluation. Imaging of chest and head showed no acute process. White fluids suctioned from oropharynx and esophagus. IV fluids were started and PCCM called for admission because patient condition was worsening with risk for decompensation of her tachypnea and tachycardia. Patient admitted for continued care of toxic ingestion, suicide attempt, altered mental status.  Fuller Plan, MD 12/23/14 1636  Nelva Nay, MD 01/04/15 1351

## 2014-12-22 NOTE — ED Notes (Signed)
Pt taken to CT scan and x-ray. 

## 2014-12-22 NOTE — ED Notes (Signed)
Bed: WA17 Expected date:  Expected time:  Means of arrival:  Comments: EMS- 34yo F. Only responds to painful stimuli/Does not speak [place]

## 2014-12-22 NOTE — Progress Notes (Addendum)
eLink Physician-Brief Progress Note Patient Name: Roberta Hoover DOB: May 02, 1980 MRN: [id number]   Date of Service  12/22/2014  HPI/Events of Note  Spoke with poison control who want to increase the 2-PAM infusion rate to 400 mg/hour. Plan is to continue 2-PAM infusion 24 hours after the last dose of atropine.   eICU Interventions  Will order: 1. Increase the 2-PAM infusion rate to 400 mg/hour.  2. Follow NIF and VC Q 6 hours.      Intervention Category Major Interventions: Other:  Sommer,Steven Dennard Nip 12/22/2014, 10:14 PM

## 2014-12-22 NOTE — ED Notes (Signed)
Brett Canales Minor, NP at bedside and done inserted central line via lt IJ. Pt tolerated well.

## 2014-12-22 NOTE — Consult Note (Addendum)
Neurology Consultation Reason for Consult: Possible seizures Referring Physician: Marchelle Gearing, M  CC: Possible seizures  History is obtained from: Patient, medical record  HPI: Roberta Hoover is a 33 y.o. female with a history of seizures who was previously treated while living in [place] who presents with suicide attempt by taking roach powder. It is unclear exactly what she took, but the husband has gone home to get what it was that she took.  Evaluation the emergency room she was found to have leukocytosis, hypokalemia, elevated glucose, lactic acidosis.  She has been having frequent eye blinking episodes and therefore neurology was consulted. In the ICU, she was seen to have what appeared to be a generalized tonic-clonic seizure and currently they are waiting loading with Keppra.   ROS: A 14 point ROS was performed and is negative except as noted in the HPI.  History reviewed. No pertinent past medical history.   Family History  Problem Relation Age of Onset  . Family history unknown: Yes     Social History:  reports that she has never smoked. She does not have any smokeless tobacco history on file. She reports that she does not drink alcohol or use illicit drugs.   Exam: Current vital signs: BP 149/82 mmHg  Pulse 115  Temp(Src) 97.3 F (36.3 C) (Core (Comment))  Resp 24  SpO2 99%  LMP  (LMP Unknown) Vital signs in last 24 hours: Temp:  [94.5 F (34.7 C)-97.7 F (36.5 C)] 97.3 F (36.3 C) (08/28 1800) Pulse Rate:  [105-123] 115 (08/28 1800) Resp:  [11-31] 24 (08/28 1800) BP: (123-149)/(60-82) 149/82 mmHg (08/28 1800) SpO2:  [75 %-100 %] 99 % (08/28 1800)   Physical Exam  Constitutional: Appears well-developed and well-nourished.  Psych: Paucity of speech Eyes: No scleral injection HENT: No OP obstrucion Head: Normocephalic.  Cardiovascular: Tachycardic Respiratory: Effort normal  GI: Soft.  No distension. There is no tenderness.  Skin: WDI  Neuro: Mental  Status: Patient is awake, alert, able tell me that she is in a hospital, and that it is summer but not which month. She is able to readily answer questions and follow commands. At times, however she'll begin blinking and become unresponsive those noxious stimuli is applied and questions are asked she will then briskly respond. No signs of aphasia or neglect Cranial Nerves: II: Visual Fields are full. Pupils are equal, round, and reactive to light.   III,IV, VI: EOMI without ptosis or diploplia.  V: Facial sensation is symmetric to temperature VII: Facial movement is symmetric.  VIII: hearing is intact to voice X: Uvula elevates symmetrically XI: Shoulder shrug is symmetric. XII: tongue is midline without atrophy or fasciculations.  Motor: She keeps her right leg outstretched with nonrhythmic shaking activity which ceases when the leg is repositioned. She sometimes has similar activity on the left side, with preserved consciousness. Sensory: She endorses sensation in all 4 extremities Deep Tendon Reflexes: 2+ and symmetric in the biceps and patellae.  Plantars: Toes are downgoing bilaterally.  Cerebellar: Patient will not perform.    I have reviewed labs in epic and the results pertinent to this consultation are: Lactate 4.93 CBC 31  I have reviewed the images obtained: CT head - negative study.   Impression: 34 yo F with intermittent episodes of eye fluttering and tremulousness as well as toxic ingestion. The current movements I am seeing do not appear to be ictal, but with the concern for GTC I agree with treating with keppra. If she continues to have  preserved mental status then I would perform an EEG in the AM. If her mental status were to decline, especially if she were to become unresponsive, then transfer to [facility] for EEG may be needed.  Recommendations: 1)  EEG 2) If mental status deteriorates, may need EEG overnight, but currently, she has preserved concisousness and the  movements that I saw do not appear to be seizure.  3) Keppra 1gm IV x 1, then  BID 4) neurology will continue to follow.   Ritta Slot, MD [facility] [phone]  If 7pm- 7am, please page neurology on call as listed in AMION.

## 2014-12-22 NOTE — ED Notes (Addendum)
Contact Poison Control possible carbonate organic poisoning and no recommendations .

## 2014-12-22 NOTE — ED Notes (Addendum)
[employer] at bedside and pt reported that she wanted to die because her husband sister moved in 4 days ago. Husband attempted to bring her to hosp earlier but pt refused to stay and when she returned home pt was acting irractic. Pt witnessed that she had mixed some powder and water at the store and pt drink a sip of liquid and husband took it from her. Pt took sip at 0930. Trumpet removed from lt nare. IV infusing NS at 951ml/hr without difficulty.

## 2014-12-22 NOTE — Progress Notes (Addendum)
ANTIBIOTIC CONSULT NOTE - INITIAL  Pharmacy Consult for Unasyn Indication: r/o sepsis  Allergies not on file  Patient Measurements:    Vital Signs: Temp: 97 F (36.1 C) (08/28 1645) Temp Source: Core (Comment) (08/28 1545) BP: 125/68 mmHg (08/28 1645) Pulse Rate: 105 (08/28 1645) Intake/Output from previous day:   Intake/Output from this shift:    Labs:  Recent Labs  12/22/14 1403  WBC 31.3*  HGB 15.0  PLT 163  CREATININE 0.73   CrCl cannot be calculated (Unknown ideal weight.). No results for input(s): VANCOTROUGH, VANCOPEAK, VANCORANDOM, GENTTROUGH, GENTPEAK, GENTRANDOM, TOBRATROUGH, TOBRAPEAK, TOBRARND, AMIKACINPEAK, AMIKACINTROU, AMIKACIN in the last 72 hours.   Microbiology: No results found for this or any previous visit (from the past 720 hour(s)).  Medical History: History reviewed. No pertinent past medical history.  Medications:  Anti-infectives    None     Assessment: 34 y.o. female admitted 12/22/2014 for intentional ingestion of roach poison.  Mental status has declined since ingestion this AM.  No evidence of infection other than lactic acidosis (probably related to ingestion) and leukocytosis, but would like to cover with Unasyn for now to r/o sepsis.  No risk factors for MDRs.  Note allergies have not been verified d/t patient with AMS and non-english speaking.  8/28 >> Unasyn >>  Tmax: hypothermic WBCs: elevated Renal: SCr wnl; no current weight LA 4.9; PCT pending  8/28 blood: IP 8/28 urine: IP S. pneumo UAg: IP Legionella UAg: IP C diff: IP   Goal of Therapy:  Eradication of infection Appropriate antibiotic dosing for indication and renal function  Plan:  Day 1 antibiotics  Unasyn 3g IV q6 hr  Follow clinical course, renal function, culture results as available  Follow for de-escalation of antibiotics and LOT   Bernadene Person, PharmD, BCPS Pager: [phone] 12/22/2014, 5:03 PM

## 2014-12-22 NOTE — ED Notes (Signed)
Patient clothing was cut via EMS. ED staff continued to cut off clothing. Patient was soiled as well. Dress, bra and underware were all the belongings on patient.

## 2014-12-22 NOTE — ED Notes (Signed)
Admitting MD at bedside.

## 2014-12-22 NOTE — Progress Notes (Signed)
Patient did NIF with good efforts, a little hard to get her to understand she needed to make a good seal with her mouth around the filter. But she pulled a -50 NIF with a leak so patient doing well at this point. Will reassess Q6 hrs.

## 2014-12-22 NOTE — ED Notes (Signed)
EMS Dispatch just called and reported that [employer] found a "powder form of cockroach treatment" and they are concerned that the Pt ingested it.

## 2014-12-22 NOTE — ED Notes (Signed)
Pt desated to 83-84% RA. O2 applied at 3lpm via [place] with improvement in sats to 98%. Pt arousable to pain by opening eyes but does not speak. Brett Canales NP at bedside.

## 2014-12-22 NOTE — ED Notes (Addendum)
Pt arrived via EMS with report of pt taking a white powder substance. Pt had trumpet in rt nare. Pt awakens for short periods answer questions and then closes eyes. Pt diaphoretic upon arrival. Pt reported that she took the "poison because she wanted to die. I want to go to heaven. I love my kids". Pt was incontinent of stool. Noted bruise to lt inner medial thigh. Pt suction white substance and clear mucus. Pt stated that she did not want to see husband at this time.

## 2014-12-22 NOTE — ED Notes (Signed)
Lab delayed, pt is being cleaned up

## 2014-12-22 NOTE — H&P (Signed)
PULMONARY / CRITICAL CARE MEDICINE   Name: Roberta Hoover MRN: [id number] DOB: [birth year]    ADMISSION DATE:  12/22/2014   REFERRING MD :  EDP  CHIEF COMPLAINT:  AMS  INITIAL PRESENTATION:  AMS  STUDIES:  8/28 ct head  SIGNIFICANT EVENTS: 8/28 suspected poison ingestation   HISTORY OF PRESENT ILLNESS:   34 yo asian female(non english speaking) whose husband reports she ingested some form of unknown white powder dissolved in water of which she took a sip. She stated she wanted to kill herself. At  0930 the husband took her to  [facility] ER but she refused to go in. Later in day she notified EMS via police department that she was ill. Transported to [facility] ED. She was coherent at that time. Her mental status has declined, her labs reveal compensated metabolic acidosis, wbc is 31, lactic acid  4.93, HR is ST 122, sbp>130. Toxicology screen is negative. PCCM asked to admit.  PAST MEDICAL HISTORY :   has no past medical history on file.  has past surgical history that includes Cesarean section. Prior to Admission medications   Not on File   Allergies not on file  FAMILY HISTORY:  has no family status information on file.  SOCIAL HISTORY:  reports that she has never smoked. She does not have any smokeless tobacco history on file. She reports that she does not drink alcohol or use illicit drugs.  REVIEW OF SYSTEMS:  NA  SUBJECTIVE:   VITAL SIGNS: Temp:  [94.5 F (34.7 C)-95.7 F (35.4 C)] 94.6 F (34.8 C) (08/28 1500) Pulse Rate:  [117-123] 120 (08/28 1500) Resp:  [15-24] 16 (08/28 1500) BP: (126-142)/(71-82) 135/82 mmHg (08/28 1500) SpO2:  [93 %-100 %] 100 % (08/28 1500) HEMODYNAMICS:   VENTILATOR SETTINGS:   INTAKE / OUTPUT: No intake or output data in the 24 hours ending 12/22/14 1522  PHYSICAL EXAMINATION: General:  WNWD asian female Neuro:  Opens eyes to noxious stimuli, no follow commands HEENT:  No JVD /LAN. Oral pharynx shows no indication of burns at this time   Cardiovascular:  HSR ST 125 Lungs:  Kussmaul type respiration  Abdomen:  Soft + bs Musculoskeletal:Intact Skin:  Warm and moist  LABS:  CBC  Recent Labs Lab 12/22/14 1403  WBC 31.3*  HGB 15.0  HCT 43.4  PLT 163   Coag's No results for input(s): APTT, INR in the last 168 hours. BMET  Recent Labs Lab 12/22/14 1403  NA 142  K 2.6*  CL 106  CO2 17*  BUN 15  CREATININE 0.73  GLUCOSE 207*   Electrolytes  Recent Labs Lab 12/22/14 1403  CALCIUM 9.5   Sepsis Markers  Recent Labs Lab 12/22/14 1415  LATICACIDVEN 4.93*   ABG  Recent Labs Lab 12/22/14 1434  PHART 7.416  PCO2ART 25.6*  PO2ART 119*   Liver Enzymes  Recent Labs Lab 12/22/14 1403  AST 42*  ALT 50  ALKPHOS 68  BILITOT 1.3*  ALBUMIN 5.1*   Cardiac Enzymes No results for input(s): TROPONINI, PROBNP in the last 168 hours. Glucose  Recent Labs Lab 12/22/14 1342  GLUCAP 187*    Imaging Dg Chest Portable 1 View  12/22/2014   CLINICAL DATA:  Overdose on LSD, shortness of breath question toxic congestion  EXAM: PORTABLE CHEST - 1 VIEW  COMPARISON:  Portable exam 1404 hours compared to 01/17/2008  FINDINGS: Normal heart size, mediastinal contours, and pulmonary vascularity.  Lungs clear.  No pleural effusion or pneumothorax.  Bones unremarkable.  IMPRESSION: No acute abnormalities.   Electronically Signed   By: Ulyses Southward M.D.   On: 12/22/2014 15:02     ASSESSMENT / PLAN:  PULMONARY OETT A: Tachypnea  P:   O2 as needed No chemical intervention Admit to ICU  CARDIOVASCULAR CVL A:  Tachycardia P:  Monitor IVF  RENAL Lab Results  Component Value Date   CREATININE 0.73 12/22/2014   CREATININE <0.47 09/07/2010   CREATININE 0.49 04/15/2008    Recent Labs Lab 12/22/14 1403  K 2.6*      A:   Hypokalemia Compensated metabolic acidosis P:   Replete lytes as needed May need bicarb drip  GASTROINTESTINAL A:   ? Poison ingestion  P:   Place NGT and  lavage PPI Unknown substance therefore check tylenol/asa/ toxicology   HEMATOLOGIC A:  Elevated WBC P:  See infection  INFECTIOUS A:   Elevated WBC but no reports of infectious process Hold abx but pan culture P:   BCx2 8/28>> UC 8/28>> Sputum 8/28 c dif>> Check procalcitonin  Abx: none  ENDOCRINE A:  No acute issue P:     NEUROLOGIC A:   AMS, questionable poision ingestion vs hysterical reaction Suicide attempt P:   RASS goal: 0 Hold all sedation CT head for completeness  Psych consult when stable   FAMILY  - Updates: none at bedside  - Inter-disciplinary family meet or Palliative Care meeting due by:  day 7    TODAY'S SUMMARY:   34 yo asian female(non english speaking) whose husband reports she ingested some form of unknown white powder dissolved in water of which she took a sip. She stated she wanted to kill herself. At  0930 the husband took her to  [facility] ER but she refused to go in. Later in day she notified EMS via police department that she was ill. Transported to [facility] ED. She was coherent at that time. Her mental status has declined, her labs reveal compensated metabolic acidosis, wbc is 31, lactic acid  4.93, HR is ST 122, sbp>130. Toxicology screen is negative. PCCM asked to admit.   Brett Canales Karlin Binion ACNP Adolph Pollack PCCM Pager [phone] till 3 pm If no answer page [phone] 12/22/2014, 3:34 PM

## 2014-12-22 NOTE — Progress Notes (Addendum)
eLink Physician-Brief Progress Note Patient Name: Roberta Hoover DOB: [birth year] MRN: [id number]   Date of Service  12/22/2014  HPI/Events of Note  Husband brought in agent that patient ingested, Acephate, which is an organophosphate.   eICU Interventions  Will order: 1. Atropine 2 mg IV now.  2. Will ask pharmacy to made a bolus and an infusion of Proxipam (2-PAM). 3. Poison Control aware of Organophosphate ingestion.      Intervention Category Major Interventions: Other:  Lenell Antu 12/22/2014, 7:30 PM

## 2014-12-22 NOTE — Progress Notes (Signed)
eLink Physician-Brief Progress Note Patient Name: Roberta Hoover DOB: 15-Dec-1980 MRN: [id number]   Date of Service  12/22/2014  HPI/Events of Note  Nurses witnessed generalized tonic clonic seizure on arrival to ICU. Neurology has already been consulted. Sat = 97% and RR 24.   eICU Interventions  Will order: 1. Keppra 1000 mg IV now. Defer to Neurology to continue Keppra.      Intervention Category Major Interventions: Seizures - evaluation and management  Tytan Sandate Dennard Nip 12/22/2014, 6:11 PM

## 2014-12-23 LAB — BASIC METABOLIC PANEL
ANION GAP: 7 (ref 5–15)
Anion gap: 4 — ABNORMAL LOW (ref 5–15)
BUN: 7 mg/dL (ref 6–20)
CALCIUM: 6.7 mg/dL — AB (ref 8.9–10.3)
CO2: 17 mmol/L — AB (ref 22–32)
CO2: 24 mmol/L (ref 22–32)
CREATININE: 0.56 mg/dL (ref 0.44–1.00)
Calcium: 7.2 mg/dL — ABNORMAL LOW (ref 8.9–10.3)
Chloride: 116 mmol/L — ABNORMAL HIGH (ref 101–111)
Chloride: 115 mmol/L — ABNORMAL HIGH (ref 101–111)
Creatinine, Ser: 0.46 mg/dL (ref 0.44–1.00)
GFR calc Af Amer: >60 mL/min (ref >60)
GFR calc non Af Amer: >60 mL/min (ref >60)
Glucose, Bld: 101 mg/dL — ABNORMAL HIGH (ref 65–99)
Glucose, Bld: 97 mg/dL (ref 65–99)
POTASSIUM: 3.7 mmol/L (ref 3.5–5.1)
POTASSIUM: 3.2 mmol/L — AB (ref 3.5–5.1)
SODIUM: 143 mmol/L (ref 135–145)
Sodium: 140 mmol/L (ref 135–145)

## 2014-12-23 LAB — URINE CULTURE
CULTURE: NO GROWTH
CULTURE: NO GROWTH

## 2014-12-23 LAB — CBC
HCT: 31.4 % — ABNORMAL LOW (ref 36.0–46.0)
HEMATOCRIT: 32.4 % — AB (ref 36.0–46.0)
HEMOGLOBIN: 10.7 g/dL — AB (ref 12.0–15.0)
Hemoglobin: 10.8 g/dL — ABNORMAL LOW (ref 12.0–15.0)
MCH: 27.3 pg (ref 26.0–34.0)
MCH: 28.4 pg (ref 26.0–34.0)
MCHC: 33 g/dL (ref 30.0–36.0)
MCHC: 34.4 g/dL (ref 30.0–36.0)
MCV: 82.7 fL (ref 78.0–100.0)
MCV: 82.6 fL (ref 78.0–100.0)
PLATELETS: 124 10*3/uL — AB (ref 150–400)
Platelets: 111 10*3/uL — ABNORMAL LOW (ref 150–400)
RBC: 3.92 MIL/uL (ref 3.87–5.11)
RBC: 3.8 MIL/uL — ABNORMAL LOW (ref 3.87–5.11)
RDW: 13.3 % (ref 11.5–15.5)
RDW: 13.5 % (ref 11.5–15.5)
WBC: 14 10*3/uL — ABNORMAL HIGH (ref 4.0–10.5)
WBC: 10 10*3/uL (ref 4.0–10.5)

## 2014-12-23 LAB — BLOOD GAS, ARTERIAL
ACID-BASE DEFICIT: 8.9 mmol/L — AB (ref 0.0–2.0)
Acid-base deficit: 1.6 mmol/L (ref 0.0–2.0)
BICARBONATE: 20.9 meq/L (ref 20.0–24.0)
Bicarbonate: 16.3 mEq/L — ABNORMAL LOW (ref 20.0–24.0)
DRAWN BY: 312761
DRAWN BY: 295031
FIO2: 0.21
O2 CONTENT: 2 L/min
O2 SAT: 97.5 %
O2 Saturation: 98.7 %
PATIENT TEMPERATURE: 98.9
PH ART: 7.291 — AB (ref 7.350–7.450)
Patient temperature: 98.6
TCO2: 15.3 mmol/L (ref 0–100)
TCO2: 18.9 mmol/L (ref 0–100)
pCO2 arterial: 35 mmHg (ref 35.0–45.0)
pCO2 arterial: 29.1 mmHg — ABNORMAL LOW (ref 35.0–45.0)
pH, Arterial: 7.469 — ABNORMAL HIGH (ref 7.350–7.450)
pO2, Arterial: 166 mmHg — ABNORMAL HIGH (ref 80.0–100.0)
pO2, Arterial: 89.7 mmHg (ref 80.0–100.0)

## 2014-12-23 LAB — TROPONIN I
TROPONIN I: 0.03 ng/mL (ref <0.031)
Troponin I: 0.06 ng/mL — ABNORMAL HIGH (ref <0.031)

## 2014-12-23 LAB — PROCALCITONIN: PROCALCITONIN: 1.51 ng/mL

## 2014-12-23 LAB — LACTIC ACID, PLASMA
LACTIC ACID, VENOUS: 0.4 mmol/L — AB (ref 0.5–2.0)
Lactic Acid, Venous: 0.7 mmol/L (ref 0.5–2.0)

## 2014-12-23 LAB — LEGIONELLA ANTIGEN, URINE

## 2014-12-23 LAB — PHOSPHORUS: PHOSPHORUS: 2.6 mg/dL (ref 2.5–4.6)

## 2014-12-23 LAB — MAGNESIUM: MAGNESIUM: 2.2 mg/dL (ref 1.7–2.4)

## 2014-12-23 MED ORDER — POTASSIUM PHOSPHATES 15 MMOLE/5ML IV SOLN
30.0000 mmol | Freq: Once | INTRAVENOUS | Status: AC
  Administered 2014-12-23: 30 mmol via INTRAVENOUS
  Filled 2014-12-23: qty 10

## 2014-12-23 MED ORDER — SODIUM BICARBONATE 8.4 % IV SOLN
INTRAVENOUS | Status: DC
  Administered 2014-12-23: 10:00:00 via INTRAVENOUS
  Filled 2014-12-23 (×3): qty 150

## 2014-12-23 MED ORDER — SODIUM CHLORIDE 0.9 % IV SOLN
INTRAVENOUS | Status: DC
  Administered 2014-12-23 – 2014-12-24 (×2): via INTRAVENOUS
  Administered 2014-12-25 (×2): 150 mL/h via INTRAVENOUS

## 2014-12-23 MED ORDER — CHLORHEXIDINE GLUCONATE 0.12 % MT SOLN
15.0000 mL | Freq: Two times a day (BID) | OROMUCOSAL | Status: DC
  Administered 2014-12-24 – 2014-12-26 (×4): 15 mL via OROMUCOSAL
  Filled 2014-12-23 (×5): qty 15

## 2014-12-23 MED ORDER — MAGNESIUM SULFATE 2 GM/50ML IV SOLN
2.0000 g | Freq: Once | INTRAVENOUS | Status: AC
  Administered 2014-12-23: 2 g via INTRAVENOUS
  Filled 2014-12-23: qty 50

## 2014-12-23 MED ORDER — SODIUM CHLORIDE 0.9 % IV BOLUS (SEPSIS)
1000.0000 mL | Freq: Once | INTRAVENOUS | Status: AC
  Administered 2014-12-23: 1000 mL via INTRAVENOUS

## 2014-12-23 MED ORDER — ATROPINE SULFATE 1 MG/ML IJ SOLN
2.0000 mg | INTRAMUSCULAR | Status: DC | PRN
  Administered 2014-12-23: 2 mg via INTRAVENOUS
  Filled 2014-12-23 (×2): qty 2

## 2014-12-23 MED ORDER — POTASSIUM CHLORIDE 10 MEQ/100ML IV SOLN
10.0000 meq | INTRAVENOUS | Status: AC
  Administered 2014-12-23 – 2014-12-24 (×5): 10 meq via INTRAVENOUS
  Filled 2014-12-23 (×6): qty 100

## 2014-12-23 MED ORDER — CETYLPYRIDINIUM CHLORIDE 0.05 % MT LIQD
7.0000 mL | Freq: Two times a day (BID) | OROMUCOSAL | Status: DC
  Administered 2014-12-23 – 2014-12-26 (×7): 7 mL via OROMUCOSAL

## 2014-12-23 MED ORDER — SODIUM CHLORIDE 0.9 % IV BOLUS (SEPSIS)
500.0000 mL | Freq: Once | INTRAVENOUS | Status: AC
  Administered 2014-12-23: 500 mL via INTRAVENOUS

## 2014-12-23 MED ORDER — POTASSIUM PHOSPHATES 15 MMOLE/5ML IV SOLN
15.0000 mmol | Freq: Once | INTRAVENOUS | Status: AC
  Administered 2014-12-23: 15 mmol via INTRAVENOUS
  Filled 2014-12-23: qty 5

## 2014-12-23 MED ORDER — NOREPINEPHRINE BITARTRATE 1 MG/ML IV SOLN
2.0000 ug/min | INTRAVENOUS | Status: DC
  Administered 2014-12-23: 2 ug/min via INTRAVENOUS
  Filled 2014-12-23: qty 4

## 2014-12-23 NOTE — Progress Notes (Signed)
NIF=-40 with Vital Capacity =.700

## 2014-12-23 NOTE — Care Management Note (Signed)
Case Management Note  Patient Details  Name: Roberta Hoover MRN: [id number] Date of Birth: 1980/08/25  Subjective/Objective:        overdose            Action/Plan:Date:  December 23, 2014 U.R. performed for needs and level of care. Will continue to follow for Case Management needs.  Marcelle Smiling, RN, BSN, [place]   [phone]   Expected Discharge Date:                  Expected Discharge Plan:  Home/Self Care  In-House Referral:  Clinical Social Work  Discharge planning Services  CM Consult  Post Acute Care Choice:  NA Choice offered to:  NA  DME Arranged:    DME Agency:     HH Arranged:    HH Agency:     Status of Service:  Completed, signed off  Medicare Important Message Given:    Date Medicare IM Given:    Medicare IM give by:    Date Additional Medicare IM Given:    Additional Medicare Important Message give by:     If discussed at Long Length of Stay Meetings, dates discussed:    Additional Comments:  Golda Acre, RN 12/23/2014, 9:01 AM

## 2014-12-23 NOTE — Progress Notes (Signed)
Patient's BP's have been soft (systolic as low as 79 and MAP as low as 48), [facility] was called and 3 bolus liters of fluid have been giving throughout the shift.  Pressure continues to be unchanged after the bolus treatment. [facility] was informed of the patient's status and we are monitoring her BPs.  Telford Nab, RN, BSN

## 2014-12-23 NOTE — Progress Notes (Signed)
Subjective: Patient awake and alert.  Per nursing no further episodes of eye blinking or tonic-clonic activity.  Remains on Keppra.    Objective: Current vital signs: BP 96/49 mmHg  Pulse 105  Temp(Src) 98.4 F (36.9 C) (Core (Comment))  Resp 22  Wt 68.3 kg (150 lb 9.2 oz)  SpO2 99%  LMP  (LMP Unknown) Vital signs in last 24 hours: Temp:  [94.5 F (34.7 C)-100 F (37.8 C)] 98.4 F (36.9 C) (08/29 1100) Pulse Rate:  [65-135] 105 (08/29 1100) Resp:  [9-31] 22 (08/29 1100) BP: (81-149)/(33-82) 96/49 mmHg (08/29 1100) SpO2:  [75 %-100 %] 99 % (08/29 1100) Weight:  [68.3 kg (150 lb 9.2 oz)] 68.3 kg (150 lb 9.2 oz) (08/29 0511)  Intake/Output from previous day: 08/28 0701 - 08/29 0700 In: 4571.3 [I.V.:1956.3; IV Piggyback:2615] Out: 2445 [Urine:2445] Intake/Output this shift: Total I/O In: 710 [I.V.:625; IV Piggyback:85] Out: 300 [Urine:300] Nutritional status: Diet NPO time specified Except for: Ice Chips  Neurologic Exam: Mental Status: Alert, oriented, thought content appropriate.  Speech fluent without evidence of aphasia but speaks little.  Able to follow 3 step commands without difficulty. Cranial Nerves: II: Visual fields grossly normal, pupils equal, round, reactive to light and accommodation III,IV, VI: ptosis not present, extra-ocular motions intact bilaterally V,VII: smile symmetric, facial light touch sensation normal bilaterally VIII: hearing normal bilaterally IX,X: not tested XI: bilateral shoulder shrug XII: midline tongue extension Motor: Moves all extremities against gravity with no focal weakness noted Deep Tendon Reflexes: 2+ and symmetric throughout Plantars: Right: downgoing   Left: downgoing    Lab Results: Basic Metabolic Panel:  Recent Labs Lab 12/22/14 1403 12/22/14 1601 12/22/14 1830 12/23/14 0400  NA 142  --   --  140  K 2.6*  --   --  3.7  CL 106  --   --  116*  CO2 17*  --   --  17*  GLUCOSE 207*  --   --  101*  BUN 15  --    --  7  CREATININE 0.73  --   --  0.46  CALCIUM 9.5  --   --  6.7*  MG  --  2.0 1.5*  --   PHOS  --  2.5 1.4*  --     Liver Function Tests:  Recent Labs Lab 12/22/14 1403  AST 42*  ALT 50  ALKPHOS 68  BILITOT 1.3*  PROT 8.6*  ALBUMIN 5.1*    Recent Labs Lab 12/22/14 1601 12/22/14 1830  LIPASE 17* 21*  AMYLASE 40 30   No results for input(s): AMMONIA in the last 168 hours.  CBC:  Recent Labs Lab 12/22/14 1403 12/23/14 0400  WBC 31.3* 14.0*  NEUTROABS 26.6*  --   HGB 15.0 10.7*  HCT 43.4 32.4*  MCV 82.4 82.7  PLT 163 111*    Cardiac Enzymes:  Recent Labs Lab 12/22/14 1601 12/22/14 1830 12/23/14 0130 12/23/14 0400  CKTOTAL  --  62  --   --   CKMB  --  6.1*  --   --   TROPONINI <0.03 <0.03 0.06* 0.03    Lipid Panel: No results for input(s): CHOL, TRIG, HDL, CHOLHDL, VLDL, LDLCALC in the last 168 hours.  CBG:  Recent Labs Lab 12/22/14 1342  GLUCAP 187*    Microbiology: Results for orders placed or performed during the hospital encounter of 12/22/14  Urine culture     Status: None (Preliminary result)   Collection Time: 12/22/14  2:16 PM  Result Value Ref Range Status   Specimen Description URINE, CATHETERIZED  Final   Special Requests NONE  Final   Culture   Final    NO GROWTH < 24 HOURS Performed at [facility]    Report Status PENDING  Incomplete  Urine culture     Status: None (Preliminary result)   Collection Time: 12/22/14  2:42 PM  Result Value Ref Range Status   Specimen Description URINE, CATHETERIZED  Final   Special Requests NONE  Final   Culture   Final    NO GROWTH < 24 HOURS Performed at [facility]    Report Status PENDING  Incomplete  C difficile quick scan w PCR reflex     Status: None   Collection Time: 12/22/14  4:10 PM  Result Value Ref Range Status   C Diff antigen NEGATIVE NEGATIVE Final   C Diff toxin NEGATIVE NEGATIVE Final   C Diff interpretation Negative for toxigenic C. difficile  Final   MRSA PCR Screening     Status: None   Collection Time: 12/22/14  6:39 PM  Result Value Ref Range Status   MRSA by PCR NEGATIVE NEGATIVE Final    Comment:        The GeneXpert MRSA Assay (FDA approved for NASAL specimens only), is one component of a comprehensive MRSA colonization surveillance program. It is not intended to diagnose MRSA infection nor to guide or monitor treatment for MRSA infections.     Coagulation Studies:  Recent Labs  12/22/14 1601  LABPROT 13.7  INR 1.03    Imaging: Ct Head Wo Contrast  12/22/2014   CLINICAL DATA:  Altered mental status.  Ingested poison.  Suicidal.  EXAM: CT HEAD WITHOUT CONTRAST  TECHNIQUE: Contiguous axial images were obtained from the base of the skull through the vertex without intravenous contrast.  COMPARISON:  None.  FINDINGS: No acute intracranial abnormality. Specifically, no hemorrhage, hydrocephalus, mass lesion, acute infarction, or significant intracranial injury. No acute calvarial abnormality. Visualized paranasal sinuses and mastoids clear. Orbital soft tissues unremarkable.  IMPRESSION: Normal study.   Electronically Signed   By: Charlett Nose M.D.   On: 12/22/2014 17:12   Dg Chest Portable 1 View  12/22/2014   CLINICAL DATA:  Central line placement  EXAM: PORTABLE CHEST - 1 VIEW  COMPARISON:  Portable exam 1706 hours compared to 1404 hours  FINDINGS: Tip of LEFT jugular central venous catheter projects over RIGHT atrium; recommend withdrawal 3 cm to position tip at cavoatrial junction.  Normal heart size, mediastinal contours, and pulmonary vascularity.  Lungs clear.  No pleural effusion or pneumothorax.  Osseous structures unremarkable.  IMPRESSION: Tip of LEFT jugular central venous catheter projects over RIGHT atrium, recommend withdrawal 3 cm.  Findings called to Dr. Marchelle Gearing on 12/22/2014 at 1715 hours.   Electronically Signed   By: Ulyses Southward M.D.   On: 12/22/2014 17:16   Dg Chest Portable 1 View  12/22/2014    CLINICAL DATA:  Overdose on LSD, shortness of breath question toxic congestion  EXAM: PORTABLE CHEST - 1 VIEW  COMPARISON:  Portable exam 1404 hours compared to 01/17/2008  FINDINGS: Normal heart size, mediastinal contours, and pulmonary vascularity.  Lungs clear.  No pleural effusion or pneumothorax.  Bones unremarkable.  IMPRESSION: No acute abnormalities.   Electronically Signed   By: Ulyses Southward M.D.   On: 12/22/2014 15:02    Medications:  I have reviewed the patient's current medications. Scheduled: . ampicillin-sulbactam (UNASYN) IV  3  g Intravenous Q6H  . antiseptic oral rinse  7 mL Mouth Rinse q12n4p  . [START ON 12/24/2014] chlorhexidine  15 mL Mouth Rinse BID  . levETIRAcetam  500 mg Intravenous Q12H  . pantoprazole (PROTONIX) IV  40 mg Intravenous QHS  . potassium phosphate IVPB (mmol)  30 mmol Intravenous Once    Assessment/Plan: Patient awake and alert.  No further questionable seizure activity noted.  Patient remains on Keppra.  Was on no anticonvulsant therapy prior to admission.  With a history of seizures.    Recommendations: 1.  Continue Keppra at current dose.  Would change to po once able to take to at same dose.  Patient will continue this at discharge.      LOS: 1 day   Thana Farr, MD [facility] [phone] 12/23/2014  12:03 PM

## 2014-12-23 NOTE — Progress Notes (Signed)
PULMONARY / CRITICAL CARE MEDICINE   Name: Roberta Hoover MRN: [id number] DOB: 12/31/1980    ADMISSION DATE:  12/22/2014   REFERRING MD :  EDP  CHIEF COMPLAINT:  AMS  INITIAL PRESENTATION:  AMS  STUDIES:  8/28 ct head  SIGNIFICANT EVENTS: 8/28 suspected poison ingestation   HISTORY OF PRESENT ILLNESS:   34 yo asian female(non english speaking) whose husband reports she ingested some form of unknown white powder dissolved in water of which she took a sip. She stated she wanted to kill herself. At  0930 the husband took her to  [facility] ER but she refused to go in. Later in day she notified EMS via police department that she was ill. Transported to [facility] ED. She was coherent at that time. Her mental status has declined, her labs reveal compensated metabolic acidosis, wbc is 31, lactic acid  4.93, HR is ST 122, sbp>130. Toxicology screen is negative. PCCM asked to admit.    SUBJECTIVE: thirsty slept well denies pain C/o loose stools  VITAL SIGNS: Temp:  [94.5 F (34.7 C)-99.9 F (37.7 C)] 99.9 F (37.7 C) (08/29 0700) Pulse Rate:  [68-135] 68 (08/29 0700) Resp:  [9-31] 25 (08/29 0700) BP: (81-149)/(33-82) 88/43 mmHg (08/29 0700) SpO2:  [75 %-100 %] 100 % (08/29 0700) Weight:  [150 lb 9.2 oz (68.3 kg)] 150 lb 9.2 oz (68.3 kg) (08/29 0511) HEMODYNAMICS: CVP:  [4 mmHg-5 mmHg] 5 mmHg VENTILATOR SETTINGS:   INTAKE / OUTPUT:  Intake/Output Summary (Last 24 hours) at 12/23/14 0842 Last data filed at 12/23/14 0700  Gross per 24 hour  Intake 4571.25 ml  Output   2445 ml  Net 2126.25 ml    PHYSICAL EXAMINATION: General:  WNWD asian female Neuro:  Interactive,follows commands HEENT:  No JVD /LAN. Pupils 5mm RTL Cardiovascular:  HSR ST 125 Lungs:  clear Abdomen:  Soft + bs Musculoskeletal:Intact Skin:  Warm and moist  LABS:  CBC  Recent Labs Lab 12/22/14 1403 12/23/14 0400  WBC 31.3* 14.0*  HGB 15.0 10.7*  HCT 43.4 32.4*  PLT 163 111*   Coag's  Recent Labs Lab  12/22/14 1601  APTT 29  INR 1.03   BMET  Recent Labs Lab 12/22/14 1403 12/23/14 0400  NA 142 140  K 2.6* 3.7  CL 106 116*  CO2 17* 17*  BUN 15 7  CREATININE 0.73 0.46  GLUCOSE 207* 101*   Electrolytes  Recent Labs Lab 12/22/14 1403 12/22/14 1601 12/22/14 1830 12/23/14 0400  CALCIUM 9.5  --   --  6.7*  MG  --  2.0 1.5*  --   PHOS  --  2.5 1.4*  --    Sepsis Markers  Recent Labs Lab 12/22/14 1601 12/22/14 1830 12/22/14 2200 12/23/14 0400  LATICACIDVEN  --  6.8* 1.4 0.4*  PROCALCITON <0.10 0.60  --  1.51   ABG  Recent Labs Lab 12/22/14 1434 12/22/14 1547 12/23/14 0350  PHART 7.416 7.449 7.291*  PCO2ART 25.6* 22.6* 35.0  PO2ART 119* 176* 166*   Liver Enzymes  Recent Labs Lab 12/22/14 1403  AST 42*  ALT 50  ALKPHOS 68  BILITOT 1.3*  ALBUMIN 5.1*   Cardiac Enzymes  Recent Labs Lab 12/22/14 1830 12/23/14 0130 12/23/14 0400  TROPONINI <0.03 0.06* 0.03   Glucose  Recent Labs Lab 12/22/14 1342  GLUCAP 187*    Imaging Ct Head Wo Contrast  12/22/2014   CLINICAL DATA:  Altered mental status.  Ingested poison.  Suicidal.  EXAM: CT HEAD WITHOUT CONTRAST  TECHNIQUE: Contiguous axial images were obtained from the base of the skull through the vertex without intravenous contrast.  COMPARISON:  None.  FINDINGS: No acute intracranial abnormality. Specifically, no hemorrhage, hydrocephalus, mass lesion, acute infarction, or significant intracranial injury. No acute calvarial abnormality. Visualized paranasal sinuses and mastoids clear. Orbital soft tissues unremarkable.  IMPRESSION: Normal study.   Electronically Signed   By: Charlett Nose M.D.   On: 12/22/2014 17:12   Dg Chest Portable 1 View  12/22/2014   CLINICAL DATA:  Central line placement  EXAM: PORTABLE CHEST - 1 VIEW  COMPARISON:  Portable exam 1706 hours compared to 1404 hours  FINDINGS: Tip of LEFT jugular central venous catheter projects over RIGHT atrium; recommend withdrawal 3 cm to  position tip at cavoatrial junction.  Normal heart size, mediastinal contours, and pulmonary vascularity.  Lungs clear.  No pleural effusion or pneumothorax.  Osseous structures unremarkable.  IMPRESSION: Tip of LEFT jugular central venous catheter projects over RIGHT atrium, recommend withdrawal 3 cm.  Findings called to Dr. Marchelle Gearing on 12/22/2014 at 1715 hours.   Electronically Signed   By: Ulyses Southward M.D.   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  Lungs clear.  No pleural effusion or pneumothorax.  Bones unremarkable.  IMPRESSION: No acute abnormalities.   Electronically Signed   By: Ulyses Southward M.D.   On: 12/22/2014 15:02     ASSESSMENT / PLAN:  PULMONARY A: Tachypnea -resolved P:   O2 as needed   CARDIOVASCULAR CVL LIJ 8/28 >> A:  Hypotension P:  Monitor IVF  RENAL Lab Results  Component Value Date   CREATININE 0.46 12/23/2014   CREATININE 0.73 12/22/2014   CREATININE <0.47 09/07/2010    Recent Labs Lab 12/22/14 1403 12/23/14 0400  K 2.6* 3.7      A:   Hypokalemia Hypophos hypomag Compensated metabolic acidosis -lactate resolved, now NAG P:   Replete lytes as needed   GASTROINTESTINAL A:  Acute gastritis P:   PPI    HEMATOLOGIC A:  Elevated WBC P:  See infection  INFECTIOUS A:   Elevated WBC but no reports of infectious process Hold abx but pan culture P:   BCx2 8/28>> UC 8/28>> Sputum 8/28 c dif>>neg Low procalcitonin  Abx: dc unasyn - no evidence aspiration  ENDOCRINE A:  No acute issue P:     NEUROLOGIC A:   Acute encephalopathy - resolved OPC ingestion -Suicide attempt ?seizure disorder P:   RASS goal: 0 Keppra per neuro  Atropine & PAM x 24 more hrs  - no e/o neuro weakness Psych consult when stable   FAMILY  - Updates: none at bedside  - Inter-disciplinary family meet or Palliative Care meeting due by:  NA    TODAY'S SUMMARY: OPC ingestion -maintain atropine & PAM x 48 h, replete lytes  The patient is critically ill with multiple organ systems failure and requires high complexity decision making for assessment and support, frequent evaluation and titration of therapies, application of advanced monitoring technologies and extensive interpretation of multiple databases. Critical Care Time devoted to patient care services described in this note independent of APP time is 32 minutes.   Cyril Mourning MD. Tonny Bollman. [facility] Pager [phone]  If no response call [phone]    12/23/2014, 8:42 AM

## 2014-12-23 NOTE — Progress Notes (Signed)
NIF: - 48 VC:    1.8L  Pt preformed NIF/VC with good effort.

## 2014-12-23 NOTE — Progress Notes (Signed)
eLink Physician-Brief Progress Note Patient Name: Roberta Hoover DOB: 1981-02-20 MRN: [id number]   Date of Service  12/23/2014  HPI/Events of Note  Called for hypotension with CVP of 6. Given 2 lt IVF but MAPs still in 50s.  ABG with Ph 7.47, bicarb 24. lactic acid 0.7, K 3.2 EKG with increased qTC of 477.  Pt is also more confused.  eICU Interventions  -Stop bicarb drip. -Continue IVF resuscitation. After 3rd bag of NS will give infusion at 150cc/hr. Follow CVP. If BP still low she will need levaphed. -Replete K and Phos to keep in upper range of normal to help with long qTC. Mg levels are OK. -Echocardiogram. -Continue 2-PAM and atropine PRN. -Plan discussed with poison control.      Intervention Category Major Interventions: Hypotension - evaluation and management  Anja Neuzil 12/23/2014, 9:33 PM
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
## 2014-12-23 NOTE — Progress Notes (Signed)
NIF -40,  VC 2.1 cm H2O

## 2014-12-24 ENCOUNTER — Inpatient Hospital Stay (HOSPITAL_COMMUNITY): Payer: BLUE CROSS/BLUE SHIELD

## 2014-12-24 DIAGNOSIS — F329 Major depressive disorder, single episode, unspecified: Secondary | ICD-10-CM

## 2014-12-24 DIAGNOSIS — I9589 Other hypotension: Secondary | ICD-10-CM

## 2014-12-24 DIAGNOSIS — T1491XA Suicide attempt, initial encounter: Secondary | ICD-10-CM | POA: Diagnosis present

## 2014-12-24 DIAGNOSIS — T65892A Toxic effect of other specified substances, intentional self-harm, initial encounter: Secondary | ICD-10-CM

## 2014-12-24 DIAGNOSIS — T600X2A Toxic effect of organophosphate and carbamate insecticides, intentional self-harm, initial encounter: Secondary | ICD-10-CM

## 2014-12-24 DIAGNOSIS — T1491 Suicide attempt: Secondary | ICD-10-CM

## 2014-12-24 LAB — BASIC METABOLIC PANEL
Anion gap: 7 (ref 5–15)
CALCIUM: 7.5 mg/dL — AB (ref 8.9–10.3)
CO2: 21 mmol/L — AB (ref 22–32)
Chloride: 113 mmol/L — ABNORMAL HIGH (ref 101–111)
Creatinine, Ser: 0.56 mg/dL (ref 0.44–1.00)
GFR calc Af Amer: >60 mL/min (ref >60)
GLUCOSE: 99 mg/dL (ref 65–99)
Potassium: 3.5 mmol/L (ref 3.5–5.1)
Sodium: 141 mmol/L (ref 135–145)

## 2014-12-24 LAB — CBC
HEMATOCRIT: 33.5 % — AB (ref 36.0–46.0)
Hemoglobin: 10.9 g/dL — ABNORMAL LOW (ref 12.0–15.0)
MCH: 27 pg (ref 26.0–34.0)
MCHC: 32.5 g/dL (ref 30.0–36.0)
MCV: 82.9 fL (ref 78.0–100.0)
PLATELETS: 114 10*3/uL — AB (ref 150–400)
RBC: 4.04 MIL/uL (ref 3.87–5.11)
RDW: 13.5 % (ref 11.5–15.5)
WBC: 10.2 10*3/uL (ref 4.0–10.5)

## 2014-12-24 LAB — MAGNESIUM: Magnesium: 2.1 mg/dL (ref 1.7–2.4)

## 2014-12-24 LAB — PHOSPHORUS: Phosphorus: 2.3 mg/dL — ABNORMAL LOW (ref 2.5–4.6)

## 2014-12-24 MED ORDER — ATROPINE SULFATE 1 MG/ML IJ SOLN
1.0000 mg | INTRAMUSCULAR | Status: DC | PRN
  Filled 2014-12-24: qty 2

## 2014-12-24 MED ORDER — POTASSIUM PHOSPHATES 15 MMOLE/5ML IV SOLN
30.0000 mmol | Freq: Once | INTRAVENOUS | Status: AC
  Administered 2014-12-24: 30 mmol via INTRAVENOUS
  Filled 2014-12-24: qty 10

## 2014-12-24 NOTE — Clinical Social Work Psych Assess (Signed)
Clinical Social Work Librarian, academic  Clinical Social Worker:  [place], [place] Date/Time:  12/24/2014, 2:57 PM Referred By:  Physician Date Referred:  12/24/14 Reason for Referral:  Behavioral Health Issues   Presenting Symptoms/Problems  Presenting Symptoms/Problems(in person's/family's own words):  Patient reports she ingested insect poisoning.    Abuse/Neglect/Trauma History  Abuse/Neglect/Trauma History:  Denies History Abuse/Neglect/Trauma History Comments (indicate dates):  N/A   Psychiatric History  Psychiatric History:  Denies History Psychiatric Medication:  None currently.   Current Mental Health Hospitalizations/Previous Mental Health History:  Patient denies any previous MH history or treatment.   Current Provider:  None currently Place and Date:  N/A  Current Medications:   Scheduled Meds: . antiseptic oral rinse  7 mL Mouth Rinse q12n4p  . chlorhexidine  15 mL Mouth Rinse BID  . levETIRAcetam  500 mg Intravenous Q12H  . pantoprazole (PROTONIX) IV  40 mg Intravenous QHS  . potassium phosphate IVPB (mmol)  30 mmol Intravenous Once   Continuous Infusions: . sodium chloride 150 mL/hr at 12/23/14 2005  . norepinephrine (LEVOPHED) Adult infusion 1 mcg/min (12/24/14 0317)  . sodium chloride 0.9 % 250 mL with pralidoxime (PROTOPAM) 2 g infusion 50 mL/hr at 12/24/14 0922   PRN Meds:.atropine     Previous Inpatient Admission/Date/Reason:  None reported   Emotional Health/Current Symptoms  Suicide/Self Harm: Suicide Attempt in the Past (date/description) Suicide Attempt in Past (date/description):  Patient reports she attempted to kill herself because she did not want her sister to leave and go to [place]. Patient reports this is first attempt. Patient denies any current SI.  Other Harmful Behavior (ex. homicidal ideation) (describe):  None reported   Psychotic/Dissociative Symptoms  Psychotic/Dissociative Symptoms: None  Reported Other Psychotic/Dissociative Symptoms:  N/A   Attention/Behavioral Symptoms  Attention/Behavioral Symptoms: Withdrawn Other Attention/Behavioral Symptoms:  Patient has flat affect and speaks softly throughout assessment.   Cognitive Impairment  Cognitive Impairment:  Within Normal Limits Other Cognitive Impairment:  Patient alert and oriented.   Mood and Adjustment  Mood and Adjustment:  Flat, Depression   Stress, Anxiety, Trauma, Any Recent Loss/Stressor  Stress, Anxiety, Trauma, Any Recent Loss/Stressor: None Reported Anxiety (frequency):  N/A  Phobia (specify):  N/A  Compulsive Behavior (specify):  N/A  Obsessive Behavior (specify):  N/A  Other Stress, Anxiety, Trauma, Any Recent Loss/Stressor:  N/A   Substance Abuse/Use  Substance Abuse/Use: None SBIRT Completed (please refer for detailed history): No Self-reported Substance Use (last use and frequency):  Patient denies any substance use.  Urinary Drug Screen Completed: Yes Alcohol Level:  <5   Environment/Housing/Living Arrangement  Environmental/Housing/Living Arrangement: Stable Housing Who is in the Home:  Husband and two children  Emergency Contact:  Abir-husband [phone])   Financial  Financial: Private Insurance   Patient's Strengths and Goals  Patient's Strengths and Goals (patient's own words):  Patient has supportive husband and stable housing.   Clinical Social Worker's Interpretive Summary  Clinical Social Workers Interpretive Summary:    CSW and psych MD rounded on patient together. Patient reports she lives at home with husband and two children (ages 1 and 4). Patient moved to [place] when she was 34 years old and lived in [place] with her family. Patient moved to  with husband in 2009. Patient's sister came to [place] to live with patient because her husband passed away. Patient reports sister decided she wanted to move to [place] with family and patient argued with her  because she wanted her to stay with her.  Patient decided she did not want to live any longer and tried to ingest insect poisoning.  Patient gave CSW permission to speak with husband. Husband confirms information and reports he knew that patient was upset and tried to tell her that he would support her and they could move back to [place] eventually. Husband confirmed no previous MH history or treatment or hospitalizations. Husband reports children were not present when patient ingested poison. Based on information gathered, no CPS report warranted at this time.  CSW will continue to follow and will assist with finding inpatient psych placement once medically stable.    Disposition  Disposition: Inpatient Referral Made [facility], [facility], [facility])    [place], [place] [phone]

## 2014-12-24 NOTE — Progress Notes (Signed)
NIF-30 and FVC 1.77L.  Pt demonstrated with good effort and technique.  RT to monitor and assess as needed.

## 2014-12-24 NOTE — Progress Notes (Signed)
NIF -30, VC 1.25

## 2014-12-24 NOTE — Progress Notes (Signed)
  Echocardiogram 2D Echocardiogram has been performed.  Roberta Hoover 12/24/2014, 10:44 AM

## 2014-12-24 NOTE — Progress Notes (Signed)
PULMONARY / CRITICAL CARE MEDICINE   Name: Roberta Hoover MRN: [id number] DOB: 07-Jan-1981    ADMISSION DATE:  12/22/2014   REFERRING MD :  EDP  CHIEF COMPLAINT:  AMS  INITIAL PRESENTATION:  AMS  STUDIES:  8/28 ct head  SIGNIFICANT EVENTS: 8/28 suspected poison ingestation   HISTORY OF PRESENT ILLNESS:   34 yo asian female(english speaking) with OPC ingestion -husband brought in bottle. She stated she wanted to kill herself. At  0930 the husband took her to  [facility] ER but she refused to go in. Later in day she notified EMS via police department that she was ill.  Toxicology screen is negative. PCCM asked to admit.    SUBJECTIVE:  Received 1 dose of atropine @ 2000 on 8/28 thirsty denies pain No diarrhea  VITAL SIGNS: Temp:  [98.2 F (36.8 C)-100 F (37.8 C)] 99 F (37.2 C) (08/30 0900) Pulse Rate:  [63-105] 69 (08/30 0900) Resp:  [12-26] 19 (08/30 0900) BP: (85-158)/(30-117) 124/60 mmHg (08/30 0900) SpO2:  [95 %-100 %] 100 % (08/30 0900) Weight:  [147 lb 14.9 oz (67.1 kg)] 147 lb 14.9 oz (67.1 kg) (08/30 0523) HEMODYNAMICS: CVP:  [2 mmHg-7 mmHg] 5 mmHg VENTILATOR SETTINGS:   INTAKE / OUTPUT:  Intake/Output Summary (Last 24 hours) at 12/24/14 0954 Last data filed at 12/24/14 0941  Gross per 24 hour  Intake 6771.45 ml  Output   6900 ml  Net -128.55 ml    PHYSICAL EXAMINATION: General:  WNWD asian female Neuro:  Interactive,follows commands HEENT:  No JVD /LAN. Pupils 5mm RTL Cardiovascular:  HSR ST 125 Lungs:  clear Abdomen:  Soft + bs Musculoskeletal:Intact Skin:  Warm and moist  LABS:  CBC  Recent Labs Lab 12/23/14 0400 12/23/14 1700 12/24/14 0323  WBC 14.0* 10.0 10.2  HGB 10.7* 10.8* 10.9*  HCT 32.4* 31.4* 33.5*  PLT 111* 124* 114*   Coag's  Recent Labs Lab 12/22/14 1601  APTT 29  INR 1.03   BMET  Recent Labs Lab 12/23/14 0400 12/23/14 1700 12/24/14 0323  NA 140 143 141  K 3.7 3.2* 3.5  CL 116* 115* 113*  CO2 17* 24 21*   BUN 7 <5* <5*  CREATININE 0.46 0.56 0.56  GLUCOSE 101* 97 99   Electrolytes  Recent Labs Lab 12/22/14 1830 12/23/14 0400 12/23/14 1700 12/24/14 0323  CALCIUM  --  6.7* 7.2* 7.5*  MG 1.5*  --  2.2 2.1  PHOS 1.4*  --  2.6 2.3*   Sepsis Markers  Recent Labs Lab 12/22/14 1601 12/22/14 1830 12/22/14 2200 12/23/14 0400 12/23/14 1700  LATICACIDVEN  --  6.8* 1.4 0.4* 0.7  PROCALCITON <0.10 0.60  --  1.51  --    ABG  Recent Labs Lab 12/22/14 1547 12/23/14 0350 12/23/14 1628  PHART 7.449 7.291* 7.469*  PCO2ART 22.6* 35.0 29.1*  PO2ART 176* 166* 89.7   Liver Enzymes  Recent Labs Lab 12/22/14 1403  AST 42*  ALT 50  ALKPHOS 68  BILITOT 1.3*  ALBUMIN 5.1*   Cardiac Enzymes  Recent Labs Lab 12/22/14 1830 12/23/14 0130 12/23/14 0400  TROPONINI <0.03 0.06* 0.03   Glucose  Recent Labs Lab 12/22/14 1342  GLUCAP 187*    Imaging No results found.   ASSESSMENT / PLAN:  PULMONARY A: Tachypnea -resolved P:   O2 as needed   CARDIOVASCULAR CVL LIJ 8/28 >> A:  Hypotension P:  Monitor Ct IVF since CVP low - ?insensible losses Levo gtt- taper to off  RENAL Lab Results  Component Value Date   CREATININE 0.56 12/24/2014   CREATININE 0.56 12/23/2014   CREATININE 0.46 12/23/2014    Recent Labs Lab 12/23/14 0400 12/23/14 1700 12/24/14 0323  K 3.7 3.2* 3.5      A:   Hypokalemia Hypophos hypomag Compensated metabolic acidosis -lactate resolved, now NAG P:   Replete lytes as needed k-phos given  GASTROINTESTINAL A:  Acute gastritis P:   PPI   HEMATOLOGIC A:  Elevated WBC P:  See infection  INFECTIOUS A:   Elevated WBC but no reports of infectious process Hold abx but pan culture P:   BCx2 8/28>>ng UC 8/28>>ng Sputum 8/28 c dif>>neg Low procalcitonin  Abx: dc unasyn - no evidence aspiration  ENDOCRINE A:  No acute issue P:     NEUROLOGIC A:   Acute encephalopathy - resolved OPC ingestion -Suicide  attempt ?seizure disorder P:   RASS goal: 0 Ct Keppra per neuro on dc Atropine & PAM x 24 more hrs - no e/o neuro weakness, can stop 8/30 Psych consult   FAMILY  - Updates: husband at bedside  - Inter-disciplinary family meet or Palliative Care meeting due by:  NA    TODAY'S SUMMARY: OPC ingestion -maintain atropine & PAM x 48 h, replete lytes  The patient is critically ill with multiple organ systems failure and requires high complexity decision making for assessment and support, frequent evaluation and titration of therapies, application of advanced monitoring technologies and extensive interpretation of multiple databases. Critical Care Time devoted to patient care services described in this note independent of APP time is 31 minutes.   Cyril Mourning MD. Tonny Bollman. [facility] Pager [phone] If no response call [phone]    12/24/2014, 9:54 AM

## 2014-12-24 NOTE — Consult Note (Signed)
[facility] Consult   Reason for Consult:  Intentional suicide overdose of insecticide Referring Physician:  Dr. Elsworth Soho Patient Identification: Roberta Hoover MRN:  [id number] Principal Diagnosis: Suicide attempt Diagnosis:   Patient Active Problem List   Diagnosis Date Noted  . Altered mental state [R41.82] 12/22/2014  . Acute encephalopathy [G93.40] 12/22/2014  . Lactic acid acidosis [E87.2] 12/22/2014  . Sepsis [A41.9] 12/22/2014  . Undetermined poisoning by corrosive and caustic substances [T54.91XA] 12/22/2014  . SIRS (systemic inflammatory response syndrome) [A41.9] 12/22/2014  . EXTERNAL HEMORRHOIDS WITHOUT MENTION COMP [K64.8] 08/31/2007  . SEIZURE DISORDER [R56.9] 08/31/2007    Total Time spent with patient: 1 hour  Subjective:   Roberta Hoover is a 34 y.o. female patient admitted with intentional suicide attempt by poison.  HPI:  [name] is a 34 years old married female who speaks English admitted to [facility] long hospital as a status post intentional suicide overdose of insecticide. Patient reportedly feeling depressed, sad, upset after having a conflict with her older sister who came to stay with her wants to leave to [place] to stay with her family members. Patient reported she likes her sister she wants her to stay with her but she cannot stop her so she has decided to poison herself with Albertina Parr powder mixed in water and drank some. Patient reported she has no previous suicidal attempts or drug overdose. Reportedly she does not want come to the emergency department initially but later she found being physically sick and then contacted emergency medical personnel. Patient is currently in intensive care unit and continued to endorse suicidal thoughts but at the same time reports that she wanted to live for 2 young children and her husband who are supportive to her. Patient urine drug screen is negative for drug of abuse. Reportedly patient has no previous mental  health treatment either here her back at home at [place]. Patient has a history of seizure disorder the past but currently she has no known seizure episodes. 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   HPI Elements:   Location:  Depression. Quality:  Fair to poor. Severity:  Intentional suicide attempt. Timing:  Conflict with sister. Duration:  One week. Context:  Psychosocial stressors and relationship problems.  Past Medical History: History reviewed. No pertinent past medical history.  Past Surgical History  Procedure Laterality Date  . Cesarean section     Family History:  Family History  Problem Relation Age of Onset  . Family history unknown: Yes   Social History:  History  Alcohol Use No     History  Drug Use No    Social History   Social History  . Marital Status: Married    Spouse Name: N/A  . Number of Children: N/A  . Years of Education: N/A   Social History Main Topics  . Smoking status: Never Smoker   . Smokeless tobacco: None  . Alcohol Use: No  . Drug Use: No  . Sexual Activity: Not Asked   Other Topics Concern  . None   Social History Narrative  . None   Additional Social History:                          Allergies:  No Known Allergies  Labs:  Results for orders placed or performed during the hospital encounter of 12/22/14 (from the past 48 hour(s))  CBG monitoring, ED     Status: Abnormal  Collection Time: 12/22/14  1:42 PM  Result Value Ref Range   Glucose-Capillary 187 (H) 65 - 99 mg/dL  CBC with Differential/Platelet     Status: Abnormal   Collection Time: 12/22/14  2:03 PM  Result Value Ref Range   WBC 31.3 (H) 4.0 - 10.5 K/uL   RBC 5.27 (H) 3.87 - 5.11 MIL/uL   Hemoglobin 15.0 12.0 - 15.0 g/dL   HCT 43.4 36.0 - 46.0 %   MCV 82.4 78.0 - 100.0 fL   MCH 28.5 26.0 - 34.0 pg   MCHC 34.6 30.0 - 36.0 g/dL   RDW 13.2 11.5 - 15.5 %   Platelets 163 150 - 400 K/uL   Neutrophils Relative % 85 (H)  43 - 77 %   Lymphocytes Relative 12 12 - 46 %   Monocytes Relative 3 3 - 12 %   Eosinophils Relative 0 0 - 5 %   Basophils Relative 0 0 - 1 %   Neutro Abs 26.6 (H) 1.7 - 7.7 K/uL   Lymphs Abs 3.8 0.7 - 4.0 K/uL   Monocytes Absolute 0.9 0.1 - 1.0 K/uL   Eosinophils Absolute 0.0 0.0 - 0.7 K/uL   Basophils Absolute 0.0 0.0 - 0.1 K/uL   Smear Review MORPHOLOGY UNREMARKABLE   Comprehensive metabolic panel     Status: Abnormal   Collection Time: 12/22/14  2:03 PM  Result Value Ref Range   Sodium 142 135 - 145 mmol/L   Potassium 2.6 (LL) 3.5 - 5.1 mmol/L    Comment: CRITICAL RESULT CALLED TO, READ BACK BY AND VERIFIED WITH: HASZ,F @ 1519 ON [id number] BY POTEAT,S    Chloride 106 101 - 111 mmol/L   CO2 17 (L) 22 - 32 mmol/L   Glucose, Bld 207 (H) 65 - 99 mg/dL   BUN 15 6 - 20 mg/dL   Creatinine, Ser 0.73 0.44 - 1.00 mg/dL   Calcium 9.5 8.9 - 10.3 mg/dL   Total Protein 8.6 (H) 6.5 - 8.1 g/dL   Albumin 5.1 (H) 3.5 - 5.0 g/dL   AST 42 (H) 15 - 41 U/L   ALT 50 14 - 54 U/L   Alkaline Phosphatase 68 38 - 126 U/L   Total Bilirubin 1.3 (H) 0.3 - 1.2 mg/dL   GFR calc non Af Amer >60 >60 mL/min   GFR calc Af Amer >60 >60 mL/min    Comment: (NOTE) The eGFR has been calculated using the CKD EPI equation. This calculation has not been validated in all clinical situations. eGFR's persistently <60 mL/min signify possible Chronic Kidney Disease.    Anion gap 19 (H) 5 - 15  Ethanol (ETOH)     Status: None   Collection Time: 12/22/14  2:03 PM  Result Value Ref Range   Alcohol, Ethyl (B) <5 <5 mg/dL    Comment:        LOWEST DETECTABLE LIMIT FOR SERUM ALCOHOL IS 5 mg/dL FOR MEDICAL PURPOSES ONLY   Salicylate level     Status: None   Collection Time: 12/22/14  2:03 PM  Result Value Ref Range   Salicylate Lvl <7.9 2.8 - 30.0 mg/dL  Acetaminophen level     Status: Abnormal   Collection Time: 12/22/14  2:03 PM  Result Value Ref Range   Acetaminophen (Tylenol), Serum <10 (L) 10 - 30 ug/mL     Comment:        THERAPEUTIC CONCENTRATIONS VARY SIGNIFICANTLY. A RANGE OF 10-30 ug/mL MAY BE AN EFFECTIVE CONCENTRATION FOR MANY PATIENTS. HOWEVER, SOME  ARE BEST TREATED AT CONCENTRATIONS OUTSIDE THIS RANGE. ACETAMINOPHEN CONCENTRATIONS >150 ug/mL AT 4 HOURS AFTER INGESTION AND >50 ug/mL AT 12 HOURS AFTER INGESTION ARE OFTEN ASSOCIATED WITH TOXIC REACTIONS.   I-Stat CG4 Lactic Acid, ED     Status: Abnormal   Collection Time: 12/22/14  2:15 PM  Result Value Ref Range   Lactic Acid, Venous 4.93 (HH) 0.5 - 2.0 mmol/L   Comment NOTIFIED PHYSICIAN   Urinalysis, Routine w reflex microscopic (not at [facility])     Status: Abnormal   Collection Time: 12/22/14  2:16 PM  Result Value Ref Range   Color, Urine YELLOW YELLOW   APPearance CLEAR CLEAR   Specific Gravity, Urine 1.021 1.005 - 1.030   pH 5.0 5.0 - 8.0   Glucose, UA 250 (A) NEGATIVE mg/dL   Hgb urine dipstick NEGATIVE NEGATIVE   Bilirubin Urine NEGATIVE NEGATIVE   Ketones, ur NEGATIVE NEGATIVE mg/dL   Protein, ur NEGATIVE NEGATIVE mg/dL   Urobilinogen, UA 0.2 0.0 - 1.0 mg/dL   Nitrite NEGATIVE NEGATIVE   Leukocytes, UA NEGATIVE NEGATIVE    Comment: MICROSCOPIC NOT DONE ON URINES WITH NEGATIVE PROTEIN, BLOOD, LEUKOCYTES, NITRITE, OR GLUCOSE <1000 mg/dL.  Urine culture     Status: None   Collection Time: 12/22/14  2:16 PM  Result Value Ref Range   Specimen Description URINE, CATHETERIZED    Special Requests NONE    Culture      NO GROWTH 1 DAY Performed at [facility]    Report Status 12/23/2014 FINAL   Urine rapid drug screen (hosp performed)     Status: None   Collection Time: 12/22/14  2:16 PM  Result Value Ref Range   Opiates NONE DETECTED NONE DETECTED   Cocaine NONE DETECTED NONE DETECTED   Benzodiazepines NONE DETECTED NONE DETECTED   Amphetamines NONE DETECTED NONE DETECTED   Tetrahydrocannabinol NONE DETECTED NONE DETECTED   Barbiturates NONE DETECTED NONE DETECTED    Comment:        DRUG SCREEN FOR  MEDICAL PURPOSES ONLY.  IF CONFIRMATION IS NEEDED FOR ANY PURPOSE, NOTIFY LAB WITHIN 5 DAYS.        LOWEST DETECTABLE LIMITS FOR URINE DRUG SCREEN Drug Class       Cutoff (ng/mL) Amphetamine      1000 Barbiturate      200 Benzodiazepine   917 Tricyclics       915 Opiates          300 Cocaine          300 THC              50   POC urine preg, ED (not at [facility])     Status: None   Collection Time: 12/22/14  2:29 PM  Result Value Ref Range   Preg Test, Ur NEGATIVE NEGATIVE    Comment:        THE SENSITIVITY OF THIS METHODOLOGY IS >24 mIU/mL   Blood gas, arterial     Status: Abnormal   Collection Time: 12/22/14  2:34 PM  Result Value Ref Range   FIO2 0.21    pH, Arterial 7.416 7.350 - 7.450   pCO2 arterial 25.6 (L) 35.0 - 45.0 mmHg   pO2, Arterial 119 (H) 80.0 - 100.0 mmHg   Bicarbonate 16.1 (L) 20.0 - 24.0 mEq/L   TCO2 14.2 0 - 100 mmol/L   Acid-base deficit 6.5 (H) 0.0 - 2.0 mmol/L   O2 Saturation 97.8 %   Patient temperature 98.6  Collection site RIGHT RADIAL    Drawn by COLLECTED BY RT    Sample type ARTERIAL DRAW    Allens test (pass/fail) PASS PASS  Urine culture     Status: None   Collection Time: 12/22/14  2:42 PM  Result Value Ref Range   Specimen Description URINE, CATHETERIZED    Special Requests NONE    Culture      NO GROWTH 1 DAY Performed at [facility]    Report Status 12/23/2014 FINAL   Strep pneumoniae urinary antigen     Status: None   Collection Time: 12/22/14  2:42 PM  Result Value Ref Range   Strep Pneumo Urinary Antigen NEGATIVE NEGATIVE    Comment:        Infection due to S. pneumoniae cannot be absolutely ruled out since the antigen present may be below the detection limit of the test. Performed at [facility]   Legionella antigen, urine     Status: None   Collection Time: 12/22/14  2:42 PM  Result Value Ref Range   Specimen Description URINE, CATHETERIZED    Special Requests NONE    Legionella Antigen, Urine       Negative for Legionella pneumophila serogroup 1                                                              Legionella pneumophila serogroup 1 antigen can be detected in urine within 2 to 3 days of infection and may persist even after treatment. This  assay does not detect other Legionella species or serogroups. Performed at [facility]    Report Status 12/23/2014 FINAL   Blood gas, arterial     Status: Abnormal   Collection Time: 12/22/14  3:47 PM  Result Value Ref Range   FIO2 0.21    Delivery systems ROOM AIR    pH, Arterial 7.449 7.350 - 7.450   pCO2 arterial 22.6 (L) 35.0 - 45.0 mmHg   pO2, Arterial 176 (H) 80.0 - 100.0 mmHg   Bicarbonate 15.4 (L) 20.0 - 24.0 mEq/L   TCO2 13.6 0 - 100 mmol/L   Acid-base deficit 6.5 (H) 0.0 - 2.0 mmol/L   O2 Saturation 98.7 %   Patient temperature 98.6    Collection site RIGHT RADIAL    Drawn by [phone]    Sample type ARTERIAL DRAW    Allens test (pass/fail) PASS PASS  Magnesium     Status: None   Collection Time: 12/22/14  4:01 PM  Result Value Ref Range   Magnesium 2.0 1.7 - 2.4 mg/dL  Phosphorus     Status: None   Collection Time: 12/22/14  4:01 PM  Result Value Ref Range   Phosphorus 2.5 2.5 - 4.6 mg/dL  Procalcitonin     Status: None   Collection Time: 12/22/14  4:01 PM  Result Value Ref Range   Procalcitonin <0.10 ng/mL    Comment:        Interpretation: PCT (Procalcitonin) <= 0.5 ng/mL: Systemic infection (sepsis) is not likely. Local bacterial infection is possible. (NOTE)         ICU PCT Algorithm               Non ICU PCT Algorithm    ----------------------------     ------------------------------  PCT < 0.25 ng/mL                 PCT < 0.1 ng/mL     Stopping of antibiotics            Stopping of antibiotics       strongly encouraged.               strongly encouraged.    ----------------------------     ------------------------------       PCT level decrease by               PCT < 0.25 ng/mL       >= 80%  from peak PCT       OR PCT 0.25 - 0.5 ng/mL          Stopping of antibiotics                                             encouraged.     Stopping of antibiotics           encouraged.    ----------------------------     ------------------------------       PCT level decrease by              PCT >= 0.25 ng/mL       < 80% from peak PCT        AND PCT >= 0.5 ng/mL            Continuin g antibiotics                                              encouraged.       Continuing antibiotics            encouraged.    ----------------------------     ------------------------------     PCT level increase compared          PCT > 0.5 ng/mL         with peak PCT AND          PCT >= 0.5 ng/mL             Escalation of antibiotics                                          strongly encouraged.      Escalation of antibiotics        strongly encouraged.   Protime-INR     Status: None   Collection Time: 12/22/14  4:01 PM  Result Value Ref Range   Prothrombin Time 13.7 11.6 - 15.2 seconds   INR 1.03 0.00 - 1.49  APTT     Status: None   Collection Time: 12/22/14  4:01 PM  Result Value Ref Range   aPTT 29 24 - 37 seconds  Amylase     Status: None   Collection Time: 12/22/14  4:01 PM  Result Value Ref Range   Amylase 40 28 - 100 U/L  Lipase, blood     Status: Abnormal   Collection Time: 12/22/14  4:01 PM  Result Value Ref Range   Lipase 17 (L) 22 - 51  U/L  Troponin I     Status: None   Collection Time: 12/22/14  4:01 PM  Result Value Ref Range   Troponin I <0.03 <0.031 ng/mL    Comment:        NO INDICATION OF MYOCARDIAL INJURY.   C difficile quick scan w PCR reflex     Status: None   Collection Time: 12/22/14  4:10 PM  Result Value Ref Range   C Diff antigen NEGATIVE NEGATIVE   C Diff toxin NEGATIVE NEGATIVE   C Diff interpretation Negative for toxigenic C. difficile   Culture, blood (routine x 2)     Status: None (Preliminary result)   Collection Time: 12/22/14  6:10 PM  Result Value Ref  Range   Specimen Description BLOOD RIGHT HAND    Special Requests IN PEDIATRIC BOTTLE    Culture      NO GROWTH < 24 HOURS Performed at [facility]    Report Status PENDING   Culture, blood (routine x 2)     Status: None (Preliminary result)   Collection Time: 12/22/14  6:10 PM  Result Value Ref Range   Specimen Description BLOOD RIGHT ARM    Special Requests BOTTLES DRAWN AEROBIC ONLY 6 CC    Culture      NO GROWTH < 24 HOURS Performed at [facility]    Report Status PENDING   Lactic acid, plasma     Status: Abnormal   Collection Time: 12/22/14  6:30 PM  Result Value Ref Range   Lactic Acid, Venous 6.8 (HH) 0.5 - 2.0 mmol/L    Comment: CRITICAL RESULT CALLED TO, READ BACK BY AND VERIFIED WITH: OAKLEY RN AT 0730 ON 08.28.16 BY SHUEA   Cortisol     Status: None   Collection Time: 12/22/14  6:30 PM  Result Value Ref Range   Cortisol, Plasma 39.5 ug/dL    Comment: (NOTE) AM    6.7 - 22.6 ug/dL PM   <10.0       ug/dL Performed at [facility]   Acetaminophen level     Status: Abnormal   Collection Time: 12/22/14  6:30 PM  Result Value Ref Range   Acetaminophen (Tylenol), Serum <10 (L) 10 - 30 ug/mL    Comment:        THERAPEUTIC CONCENTRATIONS VARY SIGNIFICANTLY. A RANGE OF 10-30 ug/mL MAY BE AN EFFECTIVE CONCENTRATION FOR MANY PATIENTS. HOWEVER, SOME ARE BEST TREATED AT CONCENTRATIONS OUTSIDE THIS RANGE. ACETAMINOPHEN CONCENTRATIONS >150 ug/mL AT 4 HOURS AFTER INGESTION AND >50 ug/mL AT 12 HOURS AFTER INGESTION ARE OFTEN ASSOCIATED WITH TOXIC REACTIONS.   Salicylate level     Status: None   Collection Time: 12/22/14  6:30 PM  Result Value Ref Range   Salicylate Lvl <4.4 2.8 - 30.0 mg/dL  Procalcitonin - Baseline     Status: None   Collection Time: 12/22/14  6:30 PM  Result Value Ref Range   Procalcitonin 0.60 ng/mL    Comment:        Interpretation: PCT > 0.5 ng/mL and <= 2 ng/mL: Systemic infection (sepsis) is possible, but other  conditions are known to elevate PCT as well. (NOTE)         ICU PCT Algorithm               Non ICU PCT Algorithm    ----------------------------     ------------------------------         PCT < 0.25 ng/mL  PCT < 0.1 ng/mL     Stopping of antibiotics            Stopping of antibiotics       strongly encouraged.               strongly encouraged.    ----------------------------     ------------------------------       PCT level decrease by               PCT < 0.25 ng/mL       >= 80% from peak PCT       OR PCT 0.25 - 0.5 ng/mL          Stopping of antibiotics                                             encouraged.     Stopping of antibiotics           encouraged.    ----------------------------     ------------------------------       PCT level decrease by              PCT >= 0.25 ng/mL       < 80% from peak PCT        AND PCT >= 0.5 ng/mL             Continuing antibiotics                                              encouraged.       Continuing antibiotics            encouraged.    ----------------------------     ------------------------------     PCT level increase compared          PCT > 0.5 ng/mL         with peak PCT AND          PCT >= 0.5 ng/mL             Escalation of antibiotics                                          strongly encouraged.      Escalation of antibiotics        strongly encouraged.   CK total and CKMB (cardiac)not at [facility]     Status: Abnormal   Collection Time: 12/22/14  6:30 PM  Result Value Ref Range   Total CK 62 38 - 234 U/L   CK, MB 6.1 (H) 0.5 - 5.0 ng/mL   Relative Index RELATIVE INDEX IS INVALID 0.0 - 2.5    Comment: WHEN CK < 100 U/L        Performed at [facility]   Amylase     Status: None   Collection Time: 12/22/14  6:30 PM  Result Value Ref Range   Amylase 30 28 - 100 U/L  Lipase, blood     Status: Abnormal   Collection Time: 12/22/14  6:30 PM  Result Value Ref Range   Lipase 21 (L) 22 - 51 U/L  Troponin I  Status: None   Collection Time: 12/22/14  6:30 PM  Result Value Ref Range   Troponin I <0.03 <0.031 ng/mL    Comment:        NO INDICATION OF MYOCARDIAL INJURY.   Magnesium     Status: Abnormal   Collection Time: 12/22/14  6:30 PM  Result Value Ref Range   Magnesium 1.5 (L) 1.7 - 2.4 mg/dL  Phosphorus     Status: Abnormal   Collection Time: 12/22/14  6:30 PM  Result Value Ref Range   Phosphorus 1.4 (L) 2.5 - 4.6 mg/dL  MRSA PCR Screening     Status: None   Collection Time: 12/22/14  6:39 PM  Result Value Ref Range   MRSA by PCR NEGATIVE NEGATIVE    Comment:        The GeneXpert MRSA Assay (FDA approved for NASAL specimens only), is one component of a comprehensive MRSA colonization surveillance program. It is not intended to diagnose MRSA infection nor to guide or monitor treatment for MRSA infections.   Lactic acid, plasma     Status: None   Collection Time: 12/22/14 10:00 PM  Result Value Ref Range   Lactic Acid, Venous 1.4 0.5 - 2.0 mmol/L  Troponin I     Status: Abnormal   Collection Time: 12/23/14  1:30 AM  Result Value Ref Range   Troponin I 0.06 (H) <0.031 ng/mL    Comment:        PERSISTENTLY INCREASED TROPONIN VALUES IN THE RANGE OF 0.04-0.49 ng/mL CAN BE SEEN IN:       -UNSTABLE ANGINA       -CONGESTIVE HEART FAILURE       -MYOCARDITIS       -CHEST TRAUMA       -ARRYHTHMIAS       -LATE PRESENTING MYOCARDIAL INFARCTION       -COPD   CLINICAL FOLLOW-UP RECOMMENDED.   Blood gas, arterial     Status: Abnormal   Collection Time: 12/23/14  3:50 AM  Result Value Ref Range   O2 Content 2.0 L/min   Delivery systems NASAL CANNULA    pH, Arterial 7.291 (L) 7.350 - 7.450   pCO2 arterial 35.0 35.0 - 45.0 mmHg   pO2, Arterial 166 (H) 80.0 - 100.0 mmHg   Bicarbonate 16.3 (L) 20.0 - 24.0 mEq/L   TCO2 15.3 0 - 100 mmol/L   Acid-base deficit 8.9 (H) 0.0 - 2.0 mmol/L   O2 Saturation 98.7 %   Patient temperature 98.9    Collection site RIGHT RADIAL    Drawn  by [id number]    Sample type ARTERIAL DRAW    Allens test (pass/fail) PASS PASS  Troponin I     Status: None   Collection Time: 12/23/14  4:00 AM  Result Value Ref Range   Troponin I 0.03 <0.031 ng/mL    Comment:        NO INDICATION OF MYOCARDIAL INJURY.   Basic metabolic panel     Status: Abnormal   Collection Time: 12/23/14  4:00 AM  Result Value Ref Range   Sodium 140 135 - 145 mmol/L   Potassium 3.7 3.5 - 5.1 mmol/L    Comment: DELTA CHECK NOTED REPEATED TO VERIFY NO VISIBLE HEMOLYSIS    Chloride 116 (H) 101 - 111 mmol/L   CO2 17 (L) 22 - 32 mmol/L   Glucose, Bld 101 (H) 65 - 99 mg/dL   BUN 7 6 - 20 mg/dL   Creatinine, Ser 0.46 0.44 -  1.00 mg/dL   Calcium 6.7 (L) 8.9 - 10.3 mg/dL    Comment: DELTA CHECK NOTED REPEATED TO VERIFY    GFR calc non Af Amer >60 >60 mL/min   GFR calc Af Amer >60 >60 mL/min    Comment: (NOTE) The eGFR has been calculated using the CKD EPI equation. This calculation has not been validated in all clinical situations. eGFR's persistently <60 mL/min signify possible Chronic Kidney Disease.    Anion gap 7 5 - 15  CBC     Status: Abnormal   Collection Time: 12/23/14  4:00 AM  Result Value Ref Range   WBC 14.0 (H) 4.0 - 10.5 K/uL   RBC 3.92 3.87 - 5.11 MIL/uL   Hemoglobin 10.7 (L) 12.0 - 15.0 g/dL    Comment: RESULT REPEATED AND VERIFIED DELTA CHECK NOTED    HCT 32.4 (L) 36.0 - 46.0 %   MCV 82.7 78.0 - 100.0 fL   MCH 27.3 26.0 - 34.0 pg   MCHC 33.0 30.0 - 36.0 g/dL   RDW 13.3 11.5 - 15.5 %   Platelets 111 (L) 150 - 400 K/uL    Comment: RESULT REPEATED AND VERIFIED PLATELET COUNT CONFIRMED BY SMEAR SPECIMEN CHECKED FOR CLOTS   Lactic acid, plasma     Status: Abnormal   Collection Time: 12/23/14  4:00 AM  Result Value Ref Range   Lactic Acid, Venous 0.4 (L) 0.5 - 2.0 mmol/L  Procalcitonin     Status: None   Collection Time: 12/23/14  4:00 AM  Result Value Ref Range   Procalcitonin 1.51 ng/mL    Comment:        Interpretation: PCT >  0.5 ng/mL and <= 2 ng/mL: Systemic infection (sepsis) is possible, but other conditions are known to elevate PCT as well. (NOTE)         ICU PCT Algorithm               Non ICU PCT Algorithm    ----------------------------     ------------------------------         PCT < 0.25 ng/mL                 PCT < 0.1 ng/mL     Stopping of antibiotics            Stopping of antibiotics       strongly encouraged.               strongly encouraged.    ----------------------------     ------------------------------       PCT level decrease by               PCT < 0.25 ng/mL       >= 80% from peak PCT       OR PCT 0.25 - 0.5 ng/mL          Stopping of antibiotics                                             encouraged.     Stopping of antibiotics           encouraged.    ----------------------------     ------------------------------       PCT level decrease by              PCT >= 0.25 ng/mL       <  80% from peak PCT        AND PCT >= 0.5 ng/mL             Continuing antibiotics                                              encouraged.       Continuing antibiotics            encouraged.    ----------------------------     ------------------------------     PCT level increase compared          PCT > 0.5 ng/mL         with peak PCT AND          PCT >= 0.5 ng/mL             Escalation of antibiotics                                          strongly encouraged.      Escalation of antibiotics        strongly encouraged.   Blood gas, arterial     Status: Abnormal   Collection Time: 12/23/14  4:28 PM  Result Value Ref Range   FIO2 0.21    Delivery systems ROOM AIR    pH, Arterial 7.469 (H) 7.350 - 7.450   pCO2 arterial 29.1 (L) 35.0 - 45.0 mmHg   pO2, Arterial 89.7 80.0 - 100.0 mmHg   Bicarbonate 20.9 20.0 - 24.0 mEq/L   TCO2 18.9 0 - 100 mmol/L   Acid-base deficit 1.6 0.0 - 2.0 mmol/L   O2 Saturation 97.5 %   Patient temperature 98.6    Collection site RIGHT RADIAL    Drawn by [phone]     Sample type ARTERIAL DRAW    Allens test (pass/fail) PASS PASS  Basic metabolic panel     Status: Abnormal   Collection Time: 12/23/14  5:00 PM  Result Value Ref Range   Sodium 143 135 - 145 mmol/L   Potassium 3.2 (L) 3.5 - 5.1 mmol/L   Chloride 115 (H) 101 - 111 mmol/L   CO2 24 22 - 32 mmol/L   Glucose, Bld 97 65 - 99 mg/dL   BUN <5 (L) 6 - 20 mg/dL   Creatinine, Ser 0.56 0.44 - 1.00 mg/dL   Calcium 7.2 (L) 8.9 - 10.3 mg/dL   GFR calc non Af Amer >60 >60 mL/min   GFR calc Af Amer >60 >60 mL/min    Comment: (NOTE) The eGFR has been calculated using the CKD EPI equation. This calculation has not been validated in all clinical situations. eGFR's persistently <60 mL/min signify possible Chronic Kidney Disease.    Anion gap 4 (L) 5 - 15  CBC     Status: Abnormal   Collection Time: 12/23/14  5:00 PM  Result Value Ref Range   WBC 10.0 4.0 - 10.5 K/uL   RBC 3.80 (L) 3.87 - 5.11 MIL/uL   Hemoglobin 10.8 (L) 12.0 - 15.0 g/dL   HCT 31.4 (L) 36.0 - 46.0 %   MCV 82.6 78.0 - 100.0 fL   MCH 28.4 26.0 - 34.0 pg   MCHC 34.4 30.0 - 36.0 g/dL   RDW 13.5 11.5 - 15.5 %  Platelets 124 (L) 150 - 400 K/uL  Lactic acid, plasma     Status: None   Collection Time: 12/23/14  5:00 PM  Result Value Ref Range   Lactic Acid, Venous 0.7 0.5 - 2.0 mmol/L  Magnesium     Status: None   Collection Time: 12/23/14  5:00 PM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

## 2014-12-25 DIAGNOSIS — T6092XA Toxic effect of unspecified pesticide, intentional self-harm, initial encounter: Principal | ICD-10-CM

## 2014-12-25 DIAGNOSIS — G9340 Encephalopathy, unspecified: Secondary | ICD-10-CM

## 2014-12-25 LAB — CBC
HCT: 33.8 % — ABNORMAL LOW (ref 36.0–46.0)
HEMOGLOBIN: 11.5 g/dL — AB (ref 12.0–15.0)
MCH: 28.1 pg (ref 26.0–34.0)
MCHC: 34 g/dL (ref 30.0–36.0)
MCV: 82.6 fL (ref 78.0–100.0)
PLATELETS: 129 10*3/uL — AB (ref 150–400)
RBC: 4.09 MIL/uL (ref 3.87–5.11)
RDW: 13.4 % (ref 11.5–15.5)
WBC: 5.8 10*3/uL (ref 4.0–10.5)

## 2014-12-25 LAB — BASIC METABOLIC PANEL
Anion gap: 8 (ref 5–15)
CHLORIDE: 110 mmol/L (ref 101–111)
CO2: 23 mmol/L (ref 22–32)
CREATININE: 0.52 mg/dL (ref 0.44–1.00)
Calcium: 8.4 mg/dL — ABNORMAL LOW (ref 8.9–10.3)
GFR calc Af Amer: >60 mL/min (ref >60)
GFR calc non Af Amer: >60 mL/min (ref >60)
GLUCOSE: 93 mg/dL (ref 65–99)
POTASSIUM: 3.8 mmol/L (ref 3.5–5.1)
SODIUM: 141 mmol/L (ref 135–145)

## 2014-12-25 LAB — MAGNESIUM: MAGNESIUM: 2.1 mg/dL (ref 1.7–2.4)

## 2014-12-25 LAB — PHOSPHORUS: Phosphorus: 3 mg/dL (ref 2.5–4.6)

## 2014-12-25 MED ORDER — SODIUM CHLORIDE 0.9 % IJ SOLN
10.0000 mL | INTRAMUSCULAR | Status: DC | PRN
Start: 2014-12-25 — End: 2014-12-26
  Administered 2014-12-25 (×3): 10 mL

## 2014-12-25 MED ORDER — LEVETIRACETAM 500 MG PO TABS
500.0000 mg | ORAL_TABLET | Freq: Two times a day (BID) | ORAL | Status: DC
  Administered 2014-12-25 – 2014-12-26 (×3): 500 mg via ORAL
  Filled 2014-12-25 (×4): qty 1

## 2014-12-25 NOTE — Progress Notes (Signed)
Subjective: No further seizures noted. Awake and alert.  Initially did not want to participate but eventually cooperated with examination.    Objective: Current vital signs: BP 140/70 mmHg  Pulse 77  Temp(Src) 98.6 F (37 C) (Core (Comment))  Resp 17  Ht 5\' 2"  (1.575 m)  Wt 65.5 kg (144 lb 6.4 oz)  BMI 26.40 kg/m2  SpO2 97%  LMP  (LMP Unknown) Vital signs in last 24 hours: Temp:  [93 F (33.9 C)-99.5 F (37.5 C)] 98.6 F (37 C) (08/31 0800) Pulse Rate:  [57-84] 77 (08/31 1000) Resp:  [8-21] 17 (08/31 1000) BP: (84-158)/(33-112) 140/70 mmHg (08/31 1000) SpO2:  [95 %-100 %] 97 % (08/31 1000) Weight:  [65.5 kg (144 lb 6.4 oz)] 65.5 kg (144 lb 6.4 oz) (08/31 0600)  Intake/Output from previous day: 08/30 0701 - 08/31 0700 In: 5821.4 [P.O.:240; I.V.:4881.4; IV Piggyback:700] Out: 8325 [Urine:8325] Intake/Output this shift: Total I/O In: 600 [P.O.:400; I.V.:200] Out: 150 [Urine:150] Nutritional status: Diet regular Room service appropriate?: Yes; Fluid consistency:: Thin  Neurologic Exam: Mental Status: Alert and awake. Speech fluent without evidence of aphasia but speaks little. Able to follow 3 step commands without difficulty. Cranial Nerves: II: Visual fields grossly normal, pupils equal, round, reactive to light and accommodation III,IV, VI: ptosis not present, extra-ocular motions intact bilaterally V,VII: smile symmetric, facial light touch sensation normal bilaterally VIII: hearing normal bilaterally IX,X: not tested XI: bilateral shoulder shrug XII: midline tongue extension Motor: Moves all extremities against gravity with no focal weakness noted Deep Tendon Reflexes: 2+ and symmetric throughout Plantars: Right: downgoingLeft: downgoing   Lab Results: Basic Metabolic Panel:  Recent Labs Lab 12/22/14 1403 12/22/14 1601 12/22/14 1830 12/23/14 0400 12/23/14 1700 12/24/14 0323 12/25/14 0342  NA 142  --   --  140 143 141  141  K 2.6*  --   --  3.7 3.2* 3.5 3.8  CL 106  --   --  116* 115* 113* 110  CO2 17*  --   --  17* 24 21* 23  GLUCOSE 207*  --   --  101* 97 99 93  BUN 15  --   --  7 <5* <5* <5*  CREATININE 0.73  --   --  0.46 0.56 0.56 0.52  CALCIUM 9.5  --   --  6.7* 7.2* 7.5* 8.4*  MG  --  2.0 1.5*  --  2.2 2.1 2.1  PHOS  --  2.5 1.4*  --  2.6 2.3* 3.0    Liver Function Tests:  Recent Labs Lab 12/22/14 1403  AST 42*  ALT 50  ALKPHOS 68  BILITOT 1.3*  PROT 8.6*  ALBUMIN 5.1*    Recent Labs Lab 12/22/14 1601 12/22/14 1830  LIPASE 17* 21*  AMYLASE 40 30   No results for input(s): AMMONIA in the last 168 hours.  CBC:  Recent Labs Lab 12/22/14 1403 12/23/14 0400 12/23/14 1700 12/24/14 0323 12/25/14 0342  WBC 31.3* 14.0* 10.0 10.2 5.8  NEUTROABS 26.6*  --   --   --   --   HGB 15.0 10.7* 10.8* 10.9* 11.5*  HCT 43.4 32.4* 31.4* 33.5* 33.8*  MCV 82.4 82.7 82.6 82.9 82.6  PLT 163 111* 124* 114* 129*    Cardiac Enzymes:  Recent Labs Lab 12/22/14 1601 12/22/14 1830 12/23/14 0130 12/23/14 0400  CKTOTAL  --  62  --   --   CKMB  --  6.1*  --   --   TROPONINI <0.03 <0.03 0.06*  0.03    Lipid Panel: No results for input(s): CHOL, TRIG, HDL, CHOLHDL, VLDL, LDLCALC in the last 168 hours.  CBG:  Recent Labs Lab 12/22/14 1342  GLUCAP 187*    Microbiology: Results for orders placed or performed during the hospital encounter of 12/22/14  Urine culture     Status: None   Collection Time: 12/22/14  2:16 PM  Result Value Ref Range Status   Specimen Description URINE, CATHETERIZED  Final   Special Requests NONE  Final   Culture   Final    NO GROWTH 1 DAY Performed at [facility]    Report Status 12/23/2014 FINAL  Final  Urine culture     Status: None   Collection Time: 12/22/14  2:42 PM  Result Value Ref Range Status   Specimen Description URINE, CATHETERIZED  Final   Special Requests NONE  Final   Culture   Final    NO GROWTH 1 DAY Performed at [facility]    Report Status 12/23/2014 FINAL  Final  C difficile quick scan w PCR reflex     Status: None   Collection Time: 12/22/14  4:10 PM  Result Value Ref Range Status   C Diff antigen NEGATIVE NEGATIVE Final   C Diff toxin NEGATIVE NEGATIVE Final   C Diff interpretation Negative for toxigenic C. difficile  Final  Culture, blood (routine x 2)     Status: None (Preliminary result)   Collection Time: 12/22/14  6:10 PM  Result Value Ref Range Status   Specimen Description BLOOD RIGHT HAND  Final   Special Requests IN PEDIATRIC BOTTLE  Final   Culture   Final    NO GROWTH 3 DAYS Performed at [facility]    Report Status PENDING  Incomplete  Culture, blood (routine x 2)     Status: None (Preliminary result)   Collection Time: 12/22/14  6:10 PM  Result Value Ref Range Status   Specimen Description BLOOD RIGHT ARM  Final   Special Requests BOTTLES DRAWN AEROBIC ONLY 6 CC  Final   Culture   Final    NO GROWTH 3 DAYS Performed at [facility]    Report Status PENDING  Incomplete  MRSA PCR Screening     Status: None   Collection Time: 12/22/14  6:39 PM  Result Value Ref Range Status   MRSA by PCR NEGATIVE NEGATIVE Final    Comment:        The GeneXpert MRSA Assay (FDA approved for NASAL specimens only), is one component of a comprehensive MRSA colonization surveillance program. It is not intended to diagnose MRSA infection nor to guide or monitor treatment for MRSA infections.     Coagulation Studies:  Recent Labs  12/22/14 1601  LABPROT 13.7  INR 1.03    Imaging: No results found.  Medications:  I have reviewed the patient's current medications. Scheduled: . antiseptic oral rinse  7 mL Mouth Rinse q12n4p  . chlorhexidine  15 mL Mouth Rinse BID  . levETIRAcetam  500 mg Oral BID    Assessment/Plan: Patient without further seizures.  On Keppra po and tolerating well.    Recommendations: 1.  Continue Keppra at current dose.  No further  neurologic intervention is recommended at this time.  If further questions arise, please call or page at that time.  Thank you for allowing neurology to participate in the care of this patient.  Patient to follow up with neurology as an outpatient at discharge.  Further decisions about the continuation of Keppra to be made at that time.      LOS: 3 days   Thana Farr, MD [facility] [phone] 12/25/2014  11:26 AM

## 2014-12-25 NOTE — Progress Notes (Signed)
Clinical Social Work  CSW and psych MD rounded on patient together. Patient reports she is feeling better and has spoken to family. Patient is happy to learn that her sister has decided to live with them for at least 1 year. Patient reports she does not feel suicidal and contracts for safety. Psych MD agreeable for patient to follow up on outpatient basis. CSW will assist with referral prior to DC.  [place], [place] [phone]

## 2014-12-25 NOTE — Consult Note (Signed)
[facility] Face-to-Face Psychiatry Consult follow-up  Reason for Consult:  Intentional suicide overdose of insecticide Referring Physician:  Dr. Elsworth Soho Patient Identification: Roberta Hoover MRN:  [id number] Principal Diagnosis: Suicide attempt Diagnosis:   Patient Active Problem List   Diagnosis Date Noted  . Suicide attempt [T14.91] 12/24/2014  . Altered mental state [R41.82] 12/22/2014  . Acute encephalopathy [G93.40] 12/22/2014  . Lactic acid acidosis [E87.2] 12/22/2014  . Sepsis [A41.9] 12/22/2014  . Undetermined poisoning by corrosive and caustic substances [T54.91XA] 12/22/2014  . SIRS (systemic inflammatory response syndrome) [A41.9] 12/22/2014  . EXTERNAL HEMORRHOIDS WITHOUT MENTION COMP [K64.8] 08/31/2007  . SEIZURE DISORDER [R56.9] 08/31/2007    Total Time spent with patient: 30 minutes  Subjective:   Roberta Hoover is a 34 y.o. female patient admitted with intentional suicide attempt by poison.  HPI:  Roberta Hoover is a 34 years old married female who speaks English admitted to [facility] long hospital as a status post intentional suicide overdose of insecticide. Patient reportedly feeling depressed, sad, upset after having a conflict with her older sister who came to stay with her wants to leave to [place] to stay with her family members. Patient reported she likes her sister she wants her to stay with her but she cannot stop her so she has decided to poison herself with Roberta Hoover powder mixed in water and drank some. Patient reported she has no previous suicidal attempts or drug overdose. Reportedly she does not want come to the emergency department initially but later she found being physically sick and then contacted emergency medical personnel. Patient is currently in intensive care unit and continued to endorse suicidal thoughts but at the same time reports that she wanted to live for 2 young children and her husband who are supportive to her. Patient urine drug screen is negative for drug of  abuse. Reportedly patient has no previous mental health treatment either here her back at home at [place]. Patient has a history of seizure disorder the past but currently she has no known seizure episodes. 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   HPI Elements:   Location:  Depression. Quality:  Fair to poor. Severity:  Intentional suicide attempt. Timing:  Conflict with sister. Duration:  One week. Context:  Psychosocial stressors and relationship problems.   Interval history: Patient seen with the psychiatric social service for psychiatric consultation follow-up. Patient stated she is feeling much better since her sister agreed to stay with her for one year. Patient stated she does not feel that she is in crisis any longer. Patient has been talking with her husband on phone before entering into the room. Patient has a Air cabin crew next to her. Patient stated she is feeling better emotionally and physically has mild dizziness but able to walk to the bathroom. Patient has no previous history of mental illness or suicidal attempts contract for safety at this time and willing to follow up with outpatient medication management stress counseling if needed.  Past Medical History: History reviewed. No pertinent past medical history.  Past Surgical History  Procedure Laterality Date  . Cesarean section     Family History:  Family History  Problem Relation Age of Onset  . Family history unknown: Yes   Social History:  History  Alcohol Use No     History  Drug Use No    Social History   Social History  . Marital Status: Married    Spouse Name: N/A  . Number of Children: N/A  .  Years of Education: N/A   Social History Main Topics  . Smoking status: Never Smoker   . Smokeless tobacco: None  . Alcohol Use: No  . Drug Use: No  . Sexual Activity: Not Asked   Other Topics Concern  . None   Social History Narrative  . None   Additional Social  History:                          Allergies:  No Known Allergies  Labs:  Results for orders placed or performed during the hospital encounter of 12/22/14 (from the past 48 hour(s))  Blood gas, arterial     Status: Abnormal   Collection Time: 12/23/14  4:28 PM  Result Value Ref Range   FIO2 0.21    Delivery systems ROOM AIR    pH, Arterial 7.469 (H) 7.350 - 7.450   pCO2 arterial 29.1 (L) 35.0 - 45.0 mmHg   pO2, Arterial 89.7 80.0 - 100.0 mmHg   Bicarbonate 20.9 20.0 - 24.0 mEq/L   TCO2 18.9 0 - 100 mmol/L   Acid-base deficit 1.6 0.0 - 2.0 mmol/L   O2 Saturation 97.5 %   Patient temperature 98.6    Collection site RIGHT RADIAL    Drawn by [phone]    Sample type ARTERIAL DRAW    Allens test (pass/fail) PASS PASS  Basic metabolic panel     Status: Abnormal   Collection Time: 12/23/14  5:00 PM  Result Value Ref Range   Sodium 143 135 - 145 mmol/L   Potassium 3.2 (L) 3.5 - 5.1 mmol/L   Chloride 115 (H) 101 - 111 mmol/L   CO2 24 22 - 32 mmol/L   Glucose, Bld 97 65 - 99 mg/dL   BUN <5 (L) 6 - 20 mg/dL   Creatinine, Ser 0.56 0.44 - 1.00 mg/dL   Calcium 7.2 (L) 8.9 - 10.3 mg/dL   GFR calc non Af Amer >60 >60 mL/min   GFR calc Af Amer >60 >60 mL/min    Comment: (NOTE) The eGFR has been calculated using the CKD EPI equation. This calculation has not been validated in all clinical situations. eGFR's persistently <60 mL/min signify possible Chronic Kidney Disease.    Anion gap 4 (L) 5 - 15  CBC     Status: Abnormal   Collection Time: 12/23/14  5:00 PM  Result Value Ref Range   WBC 10.0 4.0 - 10.5 K/uL   RBC 3.80 (L) 3.87 - 5.11 MIL/uL   Hemoglobin 10.8 (L) 12.0 - 15.0 g/dL   HCT 31.4 (L) 36.0 - 46.0 %   MCV 82.6 78.0 - 100.0 fL   MCH 28.4 26.0 - 34.0 pg   MCHC 34.4 30.0 - 36.0 g/dL   RDW 13.5 11.5 - 15.5 %   Platelets 124 (L) 150 - 400 K/uL  Lactic acid, plasma     Status: None   Collection Time: 12/23/14  5:00 PM  Result Value Ref Range   Lactic Acid, Venous 0.7  0.5 - 2.0 mmol/L  Magnesium     Status: None   Collection Time: 12/23/14  5:00 PM  Result Value Ref Range   Magnesium 2.2 1.7 - 2.4 mg/dL  Phosphorus     Status: None   Collection Time: 12/23/14  5:00 PM  Result Value Ref Range   Phosphorus 2.6 2.5 - 4.6 mg/dL  CBC     Status: Abnormal   Collection Time: 12/24/14  3:23 AM  Result  Value Ref Range   WBC 10.2 4.0 - 10.5 K/uL   RBC 4.04 3.87 - 5.11 MIL/uL   Hemoglobin 10.9 (L) 12.0 - 15.0 g/dL   HCT 33.5 (L) 36.0 - 46.0 %   MCV 82.9 78.0 - 100.0 fL   MCH 27.0 26.0 - 34.0 pg   MCHC 32.5 30.0 - 36.0 g/dL   RDW 13.5 11.5 - 15.5 %   Platelets 114 (L) 150 - 400 K/uL    Comment: SPECIMEN CHECKED FOR CLOTS REPEATED TO VERIFY GIANT PLATELETS SEEN PLATELET COUNT CONFIRMED BY SMEAR   Basic metabolic panel     Status: Abnormal   Collection Time: 12/24/14  3:23 AM  Result Value Ref Range   Sodium 141 135 - 145 mmol/L   Potassium 3.5 3.5 - 5.1 mmol/L   Chloride 113 (H) 101 - 111 mmol/L   CO2 21 (L) 22 - 32 mmol/L   Glucose, Bld 99 65 - 99 mg/dL   BUN <5 (L) 6 - 20 mg/dL   Creatinine, Ser 0.56 0.44 - 1.00 mg/dL   Calcium 7.5 (L) 8.9 - 10.3 mg/dL   GFR calc non Af Amer >60 >60 mL/min   GFR calc Af Amer >60 >60 mL/min    Comment: (NOTE) The eGFR has been calculated using the CKD EPI equation. This calculation has not been validated in all clinical situations. eGFR's persistently <60 mL/min signify possible Chronic Kidney Disease.    Anion gap 7 5 - 15  Magnesium     Status: None   Collection Time: 12/24/14  3:23 AM  Result Value Ref Range   Magnesium 2.1 1.7 - 2.4 mg/dL  Phosphorus     Status: Abnormal   Collection Time: 12/24/14  3:23 AM  Result Value Ref Range   Phosphorus 2.3 (L) 2.5 - 4.6 mg/dL  CBC     Status: Abnormal   Collection Time: 12/25/14  3:42 AM  Result Value Ref Range   WBC 5.8 4.0 - 10.5 K/uL   RBC 4.09 3.87 - 5.11 MIL/uL   Hemoglobin 11.5 (L) 12.0 - 15.0 g/dL   HCT 33.8 (L) 36.0 - 46.0 %   MCV 82.6 78.0 -  100.0 fL   MCH 28.1 26.0 - 34.0 pg   MCHC 34.0 30.0 - 36.0 g/dL   RDW 13.4 11.5 - 15.5 %   Platelets 129 (L) 150 - 400 K/uL  Basic metabolic panel     Status: Abnormal   Collection Time: 12/25/14  3:42 AM  Result Value Ref Range   Sodium 141 135 - 145 mmol/L   Potassium 3.8 3.5 - 5.1 mmol/L   Chloride 110 101 - 111 mmol/L   CO2 23 22 - 32 mmol/L   Glucose, Bld 93 65 - 99 mg/dL   BUN <5 (L) 6 - 20 mg/dL   Creatinine, Ser 0.52 0.44 - 1.00 mg/dL   Calcium 8.4 (L) 8.9 - 10.3 mg/dL   GFR calc non Af Amer >60 >60 mL/min   GFR calc Af Amer >60 >60 mL/min    Comment: (NOTE) The eGFR has been calculated using the CKD EPI equation. This calculation has not been validated in all clinical situations. eGFR's persistently <60 mL/min signify possible Chronic Kidney Disease.    Anion gap 8 5 - 15  Magnesium     Status: None   Collection Time: 12/25/14  3:42 AM  Result Value Ref Range   Magnesium 2.1 1.7 - 2.4 mg/dL  Phosphorus     Status: None  Collection Time: 12/25/14  3:42 AM  Result Value Ref Range   Phosphorus 3.0 2.5 - 4.6 mg/dL    Vitals: Blood pressure 91/41, pulse 70, temperature 98.1 F (36.7 C), temperature source Core (Comment), resp. rate 16, height '5\' 2"'  (1.575 m), weight 65.5 kg (144 lb 6.4 oz), SpO2 99 %.  Risk to Self: Is patient at risk for suicide?: Yes Risk to Others:   Prior Inpatient Therapy:   Prior Outpatient Therapy:    Current Facility-Administered Medications  Medication Dose Route Frequency Provider Last Rate Last Dose  . antiseptic oral rinse (CPC / CETYLPYRIDINIUM CHLORIDE 0.05%) solution 7 mL  7 mL Mouth Rinse q12n4p Brand Males, MD   7 mL at 12/24/14 1553  . chlorhexidine (PERIDEX) 0.12 % solution 15 mL  15 mL Mouth Rinse BID Brand Males, MD   15 mL at 12/25/14 0918  . levETIRAcetam (KEPPRA) tablet 500 mg  500 mg Oral BID Rigoberto Noel, MD   500 mg at 12/25/14 1257    Musculoskeletal: Strength & Muscle Tone: within normal limits Gait &  Station: unable to stand Patient leans: Backward  Psychiatric Specialty Exam: Physical Exam as per history and physical   ROS dizziness, somewhat drowsy but denied nausea, vomiting, abdominal pain, chest pain and shortness of breath No Fever-chills, No Headache, No changes with Vision or hearing, reports vertigo No problems swallowing food or Liquids, No Chest pain, Cough or Shortness of Breath, No Abdominal pain, No Nausea or Vommitting, Bowel movements are regular, No Blood in stool or Urine, No dysuria, No new skin rashes or bruises, No new joints pains-aches,  No new weakness, tingling, numbness in any extremity, No recent weight gain or loss, No polyuria, polydypsia or polyphagia,   A full 10 point Review of Systems was done, except as stated above, all other Review of Systems were negative.  Blood pressure 91/41, pulse 70, temperature 98.1 F (36.7 C), temperature source Core (Comment), resp. rate 16, height '5\' 2"'  (1.575 m), weight 65.5 kg (144 lb 6.4 oz), SpO2 99 %.Body mass index is 26.4 kg/(m^2).  General Appearance: Casual  Eye Contact::  Good  Speech:  Clear and Coherent  Volume:  Decreased  Mood:  Depressed  Affect:  Constricted and Depressed  Thought Process:  Coherent and Goal Directed  Orientation:  Full (Time, Place, and Person)  Thought Content:  WDL  Suicidal Thoughts:  Yes.  with intent/plan  Homicidal Thoughts:  No  Memory:  Immediate;   Good Recent;   Good Remote;   Good  Judgement:  Impaired  Insight:  Shallow  Psychomotor Activity:  Decreased  Concentration:  Good  Recall:  [place] of Knowledge:Good  Language: Good  Akathisia:  Negative  Handed:  Right  AIMS (if indicated):     Assets:  Communication Skills Desire for Improvement Financial Resources/Insurance Housing Intimacy Leisure Time Physical Health Resilience Social Support Talents/Skills Transportation  ADL's:  Impaired  Cognition: WNL  Sleep:      Medical Decision  Making: Review of Psycho-Social Stressors (1), Review or order clinical lab tests (1), Discuss test with performing physician (1), Review of Last Therapy Session (1), Review or order medicine tests (1), Review of Medication Regimen & Side Effects (2) and Review of New Medication or Change in Dosage (2)  Treatment Plan Summary: Patient appeared with the less symptoms of depression and status post intentional suicide attempt by drinking insecticide after disagreement with her sister. Patient contract for safety at this time. Daily contact  with patient to assess and evaluate symptoms and progress in treatment and Medication management  Plan:  Recommended no psychiatric medications at this time. Patient does not meet criteria for psychiatric inpatient admission. Supportive therapy provided about ongoing stressors.  Appreciate psychiatric consultation and we sign off on her  Please contact [phone] or [phone] if needs further assistance  Disposition: Refer to the outpatient psychiatric treatment when medically stable.  Delesia Martinek,JANARDHAHA R. 12/25/2014 2:55 PM

## 2014-12-25 NOTE — Progress Notes (Signed)
Pt asleep, resting comfortably.  NIF/VC not done at this time.

## 2014-12-25 NOTE — Progress Notes (Signed)
NIF (best of 3 attempts)= -60  FVC (best of 3 attempts)= 2.9 liters

## 2014-12-25 NOTE — Progress Notes (Signed)
Received report from [facility] ICU, Pt arrived unit, alert and oriented, able to communicate needs. MD notified of Pt's location. Will continue with current plan of care.

## 2014-12-25 NOTE — Progress Notes (Addendum)
PULMONARY / CRITICAL CARE MEDICINE   Name: Roberta Hoover MRN: [id number] DOB: 15-Feb-1981    ADMISSION DATE:  12/22/2014   REFERRING MD :  EDP  CHIEF COMPLAINT:  AMS  INITIAL PRESENTATION:  AMS  STUDIES:  8/28 ct head  SIGNIFICANT EVENTS: 8/28 suspected poison ingestation   HISTORY OF PRESENT ILLNESS:   34 yo asian female(english speaking) with OPC ingestion -husband brought in bottle. She stated she wanted to kill herself. At  0930 the husband took her to  [facility] ER but she refused to go in. Later in day she notified EMS via police department that she was ill.  Toxicology screen is negative. PCCM asked to admit.    SUBJECTIVE:   VITAL SIGNS: Temp:  [93 F (33.9 C)-99.5 F (37.5 C)] 98.6 F (37 C) (08/31 0800) Pulse Rate:  [57-84] 70 (08/31 0800) Resp:  [8-23] 16 (08/31 0800) BP: (84-158)/(31-112) 133/65 mmHg (08/31 0800) SpO2:  [95 %-100 %] 98 % (08/31 0800) Weight:  [65.5 kg (144 lb 6.4 oz)] 65.5 kg (144 lb 6.4 oz) (08/31 0600)  Room air  HEMODYNAMICS: CVP:  [5 mmHg-9 mmHg] 8 mmHg VENTILATOR SETTINGS:   INTAKE / OUTPUT:  Intake/Output Summary (Last 24 hours) at 12/25/14 0850 Last data filed at 12/25/14 0800  Gross per 24 hour  Intake 5807.6 ml  Output   8275 ml  Net -2467.4 ml    PHYSICAL EXAMINATION: General:  WNWD asian female Neuro:  Interactive,follows commands, affect flat  HEENT:  No JVD /LAN. Pupils 5mm RTL Cardiovascular:  HSR ST 125 Lungs:  clear Abdomen:  Soft + bs Musculoskeletal:Intact Skin:  Warm and moist  LABS:  CBC  Recent Labs Lab 12/23/14 1700 12/24/14 0323 12/25/14 0342  WBC 10.0 10.2 5.8  HGB 10.8* 10.9* 11.5*  HCT 31.4* 33.5* 33.8*  PLT 124* 114* 129*   Coag's  Recent Labs Lab 12/22/14 1601  APTT 29  INR 1.03   BMET  Recent Labs Lab 12/23/14 1700 12/24/14 0323 12/25/14 0342  NA 143 141 141  K 3.2* 3.5 3.8  CL 115* 113* 110  CO2 24 21* 23  BUN <5* <5* <5*  CREATININE 0.56 0.56 0.52  GLUCOSE 97 99 93    Electrolytes  Recent Labs Lab 12/23/14 1700 12/24/14 0323 12/25/14 0342  CALCIUM 7.2* 7.5* 8.4*  MG 2.2 2.1 2.1  PHOS 2.6 2.3* 3.0   Sepsis Markers  Recent Labs Lab 12/22/14 1601 12/22/14 1830 12/22/14 2200 12/23/14 0400 12/23/14 1700  LATICACIDVEN  --  6.8* 1.4 0.4* 0.7  PROCALCITON <0.10 0.60  --  1.51  --    ABG  Recent Labs Lab 12/22/14 1547 12/23/14 0350 12/23/14 1628  PHART 7.449 7.291* 7.469*  PCO2ART 22.6* 35.0 29.1*  PO2ART 176* 166* 89.7   Liver Enzymes  Recent Labs Lab 12/22/14 1403  AST 42*  ALT 50  ALKPHOS 68  BILITOT 1.3*  ALBUMIN 5.1*   Cardiac Enzymes  Recent Labs Lab 12/22/14 1830 12/23/14 0130 12/23/14 0400  TROPONINI <0.03 0.06* 0.03   Glucose  Recent Labs Lab 12/22/14 1342  GLUCAP 187*    Imaging No results found.   ASSESSMENT / PLAN:  Acute encephalopathy - in the setting of OPC ingestion -Suicide attempt ?seizure disorder  Plan:   Ct Keppra per neuro on dc D/c protopam Psych consulted: recommends in-patient once medically cleared   Hypotension-->resolved  Plan: KVO IVF Encourage POs  Gastric distress/ cramping. May have been a result initially of OCP overdose, at this point could  be PAM related.  Plan:   PPI Dc PAM  Reg diet   Leukocytosis-->off abx now; PCT neg Plan:   BCx2 8/28>>ng UC 8/28>>ng 8/28 c dif>>neg Trend CBC and fever curve   Ready to go to floor. Will ask IM to assume care.      FAMILY  - Updates: husband at bedside  - Inter-disciplinary family meet or Palliative Care meeting due by:  NA    TODAY'S SUMMARY: OPC ingestion -dc atropine & PAM x 48.    Attending note-  Doing better , dc atropine & PAM Normotensive now, offlevo gtt Advance diet Ct keppra per neuro Transfer to floor &  To triad  ALVA,RAKESH V. MD    12/25/2014, 8:50 AM

## 2014-12-26 LAB — HEPATIC FUNCTION PANEL
ALBUMIN: 4.2 g/dL (ref 3.5–5.0)
ALT: 144 U/L — ABNORMAL HIGH (ref 14–54)
AST: 98 U/L — ABNORMAL HIGH (ref 15–41)
Alkaline Phosphatase: 64 U/L (ref 38–126)
BILIRUBIN TOTAL: 0.7 mg/dL (ref 0.3–1.2)
Bilirubin, Direct: <0.1 mg/dL — ABNORMAL LOW (ref 0.1–0.5)
TOTAL PROTEIN: 7.7 g/dL (ref 6.5–8.1)

## 2014-12-26 LAB — GI PATHOGEN PANEL BY PCR, STOOL
C DIFFICILE TOXIN A/B: NOT DETECTED
CRYPTOSPORIDIUM BY PCR: NOT DETECTED
Campylobacter by PCR: NOT DETECTED
E COLI (ETEC) LT/ST: NOT DETECTED
E COLI (STEC): NOT DETECTED
E COLI 0157 BY PCR: NOT DETECTED
G LAMBLIA BY PCR: NOT DETECTED
NOROVIRUS G1/G2: NOT DETECTED
Rotavirus A by PCR: NOT DETECTED
Salmonella by PCR: NOT DETECTED
Shigella by PCR: NOT DETECTED

## 2014-12-26 MED ORDER — SODIUM CHLORIDE 0.9 % IJ SOLN: 10.0000 mL | INTRAMUSCULAR | Status: DC | PRN

## 2014-12-26 MED ORDER — LEVETIRACETAM 500 MG PO TABS: 500.0000 mg | ORAL_TABLET | Freq: Two times a day (BID) | ORAL | Status: DC

## 2014-12-26 NOTE — Progress Notes (Signed)
PT DC home with spouse. She was provided education and instructions. Says she understand. Escorted out via WC.

## 2014-12-26 NOTE — Progress Notes (Signed)
Information to [facility] given to patient for hospital follow up and pcp/Rhonda Davis,RN,BSN,CCM

## 2014-12-26 NOTE — Discharge Summary (Signed)
Physician Discharge Summary  Roberta Hoover MRN: [id number] DOB/AGE: 32-May-1982 32 y.o.  PCP: No primary care Eivan Gallina on file.   Admit date: 12/22/2014 Discharge date: 12/26/2014  Discharge Diagnoses:   Principal Problem:   Suicide attempt Active Problems:   Altered mental state   Acute encephalopathy   Lactic acid acidosis   Sepsis   Undetermined poisoning by corrosive and caustic substances   SIRS (systemic inflammatory response syndrome)    Follow-up recommendations Follow-up with PCP in 3-5 days , including all  additional recommended appointments as below Follow-up CBC, CMP in 3-5 days      Medication List    TAKE these medications        levETIRAcetam 500 MG tablet  Commonly known as:  KEPPRA  Take 1 tablet (500 mg total) by mouth 2 (two) times daily.         Discharge Condition: *Stable  Disposition: 01-Home or Self Care   Consults:  Critical care   Significant Diagnostic Studies:  Ct Head Wo Contrast  12/22/2014   CLINICAL DATA:  Altered mental status.  Ingested poison.  Suicidal.  EXAM: CT HEAD WITHOUT CONTRAST  TECHNIQUE: Contiguous axial images were obtained from the base of the skull through the vertex without intravenous contrast.  COMPARISON:  None.  FINDINGS: No acute intracranial abnormality. Specifically, no hemorrhage, hydrocephalus, mass lesion, acute infarction, or significant intracranial injury. No acute calvarial abnormality. Visualized paranasal sinuses and mastoids clear. Orbital soft tissues unremarkable.  IMPRESSION: Normal study.   Electronically Signed   By: Rolm Baptise M.D.   On: 12/22/2014 17:12   Dg Chest Portable 1 View  12/22/2014   CLINICAL DATA:  Central line placement  EXAM: PORTABLE CHEST - 1 VIEW  COMPARISON:  Portable exam 1706 hours compared to 1404 hours  FINDINGS: Tip of LEFT jugular central venous catheter projects over RIGHT atrium; recommend withdrawal 3 cm to position tip at cavoatrial junction.  Normal heart size,  mediastinal contours, and pulmonary vascularity.  Lungs clear.  No pleural effusion or pneumothorax.  Osseous structures unremarkable.  IMPRESSION: Tip of LEFT jugular central venous catheter projects over RIGHT atrium, recommend withdrawal 3 cm.  Findings called to Dr. Chase Caller on 12/22/2014 at 1715 hours.   Electronically Signed   By: Lavonia Dana M.D.   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  Lungs clear.  No pleural effusion or pneumothorax.  Bones unremarkable.  IMPRESSION: No acute abnormalities.   Electronically Signed   By: Lavonia Dana M.D.   On: 12/22/2014 15:02        Filed Weights   12/23/14 0511 12/24/14 0523 12/25/14 0600  Weight: 68.3 kg (150 lb 9.2 oz) 67.1 kg (147 lb 14.9 oz) 65.5 kg (144 lb 6.4 oz)     Microbiology: Recent Results (from the past 240 hour(s))  Urine culture     Status: None   Collection Time: 12/22/14  2:16 PM  Result Value Ref Range Status   Specimen Description URINE, CATHETERIZED  Final   Special Requests NONE  Final   Culture   Final    NO GROWTH 1 DAY Performed at [facility]    Report Status 12/23/2014 FINAL  Final  Urine culture  Status: None   Collection Time: 12/22/14  2:42 PM  Result Value Ref Range Status   Specimen Description URINE, CATHETERIZED  Final   Special Requests NONE  Final   Culture   Final    NO GROWTH 1 DAY Performed at [facility]    Report Status 12/23/2014 FINAL  Final  C difficile quick scan w PCR reflex     Status: None   Collection Time: 12/22/14  4:10 PM  Result Value Ref Range Status   C Diff antigen NEGATIVE NEGATIVE Final   C Diff toxin NEGATIVE NEGATIVE Final   C Diff interpretation Negative for toxigenic C. difficile   Final  Culture, blood (routine x 2)     Status: None (Preliminary result)   Collection Time: 12/22/14  6:10 PM  Result Value Ref Range Status   Specimen Description BLOOD RIGHT HAND  Final   Special Requests IN PEDIATRIC BOTTLE  Final   Culture   Final    NO GROWTH 4 DAYS Performed at [facility]    Report Status PENDING  Incomplete  Culture, blood (routine x 2)     Status: None (Preliminary result)   Collection Time: 12/22/14  6:10 PM  Result Value Ref Range Status   Specimen Description BLOOD RIGHT ARM  Final   Special Requests BOTTLES DRAWN AEROBIC ONLY 6 CC  Final   Culture   Final    NO GROWTH 4 DAYS Performed at [facility]    Report Status PENDING  Incomplete  MRSA PCR Screening     Status: None   Collection Time: 12/22/14  6:39 PM  Result Value Ref Range Status   MRSA by PCR NEGATIVE NEGATIVE Final    Comment:        The GeneXpert MRSA Assay (FDA approved for NASAL specimens only), is one component of a comprehensive MRSA colonization surveillance program. It is not intended to diagnose MRSA infection nor to guide or monitor treatment for MRSA infections.        Blood Culture    Component Value Date/Time   SDES BLOOD RIGHT HAND 12/22/2014 1810   SDES BLOOD RIGHT ARM 12/22/2014 1810   SPECREQUEST IN PEDIATRIC BOTTLE 12/22/2014 1810   SPECREQUEST BOTTLES DRAWN AEROBIC ONLY 6 CC 12/22/2014 1810   CULT  12/22/2014 1810    NO GROWTH 4 DAYS Performed at [place] [facility]  12/22/2014 1810    NO GROWTH 4 DAYS Performed at [place] PENDING 12/22/2014 1810   REPTSTATUS PENDING 12/22/2014 1810      Labs: Results for orders placed or performed during the hospital encounter of 12/22/14 (from the past 48 hour(s))  CBC     Status: Abnormal   Collection Time: 12/25/14  3:42 AM  Result Value Ref Range   WBC 5.8 4.0 - 10.5 K/uL   RBC 4.09 3.87 - 5.11 MIL/uL   Hemoglobin 11.5 (L) 12.0 - 15.0 g/dL   HCT 33.8  (L) 36.0 - 46.0 %   MCV 82.6 78.0 - 100.0 fL   MCH 28.1 26.0 - 34.0 pg   MCHC 34.0 30.0 - 36.0 g/dL   RDW 13.4 11.5 - 15.5 %   Platelets 129 (L) 150 - 400 K/uL  Basic metabolic panel     Status: Abnormal   Collection Time: 12/25/14  3:42 AM  Result Value Ref Range   Sodium 141 135 - 145 mmol/L   Potassium 3.8 3.5 - 5.1  mmol/L   Chloride 110 101 - 111 mmol/L   CO2 23 22 - 32 mmol/L   Glucose, Bld 93 65 - 99 mg/dL   BUN <5 (L) 6 - 20 mg/dL   Creatinine, Ser 0.52 0.44 - 1.00 mg/dL   Calcium 8.4 (L) 8.9 - 10.3 mg/dL   GFR calc non Af Amer >60 >60 mL/min   GFR calc Af Amer >60 >60 mL/min    Comment: (NOTE) The eGFR has been calculated using the CKD EPI equation. This calculation has not been validated in all clinical situations. eGFR's persistently <60 mL/min signify possible Chronic Kidney Disease.    Anion gap 8 5 - 15  Magnesium     Status: None   Collection Time: 12/25/14  3:42 AM  Result Value Ref Range   Magnesium 2.1 1.7 - 2.4 mg/dL  Phosphorus     Status: None   Collection Time: 12/25/14  3:42 AM  Result Value Ref Range   Phosphorus 3.0 2.5 - 4.6 mg/dL  Hepatic function panel     Status: Abnormal   Collection Time: 12/26/14 10:40 AM  Result Value Ref Range   Total Protein 7.7 6.5 - 8.1 g/dL   Albumin 4.2 3.5 - 5.0 g/dL   AST 98 (H) 15 - 41 U/L   ALT 144 (H) 14 - 54 U/L   Alkaline Phosphatase 64 38 - 126 U/L   Total Bilirubin 0.7 0.3 - 1.2 mg/dL   Bilirubin, Direct <0.1 (L) 0.1 - 0.5 mg/dL   Indirect Bilirubin NOT CALCULATED 0.3 - 0.9 mg/dL     Lipid Panel  No results found for: CHOL, TRIG, HDL, CHOLHDL, VLDL, LDLCALC, LDLDIRECT   No results found for: HGBA1C   Lab Results  Component Value Date   CREATININE 0.52 12/25/2014     HPI :*34 year old Roberta Hoover female. REfugeee immigrant from [place] in 2008. Has pmhx of "epilepsy" in [place] for which she tookl meds for 1st 10 months In [place] And then none. No known past hx. Husband reports baseline emotional  lability. 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 3days ago her widowed sister age 65 immigrated from [place] as a refugee. Husband reports that since then significant verbal altercations between the 2 sister much of it related to immigrant adjustment and orientation. Husband advised wife repeatedly to go easy with new immigrant sister in Sports [employer]. And in a fit of anger this morning patient took a spoon of roach powder (brand unknown but husband will find out) t 9.30am. GOt ill later in afternoon and brought into ER (initially apparently came to ER but did not check in in AM)  IN ER noted to be hypothermic, tachycardic, tachypneic, intermittently lucid, blinking eyes at fast rate and said once "I have seizure" and also significant diarreha x 2-3 times 0-liquid brown stools. Lactic acid was 4 at the time of admission. Patient was hypothermic   HOSPITAL COURSE:   Patient admitted on 8/28 for suspected organophosphate poisoning. She endorsed suicidal ideation. Admitted by critical care to the ICU found to have lactic acidosis on admission, confused and encephalopathic. Patient also had seizures. Started on Keppra. Patient had a neurology consult on 8/28 due to frequent eye blinking episodes. She also appeared to have a generalized tonic-clonic seizure. Advised to continue with [employer] neurology signed off. Initial CT of the head was negative. Patient also evaluated by psychiatry,Patient reports she is feeling better and has spoken to family. Patient is happy to learn that her sister has  decided to live with them for at least 1 year. Patient reports she does not feel suicidal and contracts for safety, and requesting to be discharged after psychiatry has given her clearance. Psych MD agreeable for patient to follow up on outpatient basis.    Discharge Exam:   Blood pressure 116/75, pulse 83, temperature 98.2 F (36.8 C), temperature source Oral, resp. rate 20, height '5\' 2"'  (1.575 m), weight 65.5 kg (144  lb 6.4 oz), SpO2 98 %.  General: WNWD asian female Neuro: Interactive,follows commands, affect flat  HEENT: No JVD /LAN. Pupils 75m RTL Cardiovascular: HSR ST 125 Lungs: clear Abdomen: Soft + bs Musculoskeletal:Intact Skin: Warm and moist        Discharge Instructions    Diet - low sodium heart healthy    Complete by:  As directed      Increase activity slowly    Complete by:  As directed            Follow-up Information    Follow up with PCP. Schedule an appointment as soon as possible for a visit in 3 days.      Signed:Reyne Dumas9/04/2014, 2:04 PM        Time spent >45 mins

## 2014-12-26 NOTE — Progress Notes (Signed)
Clinical Social Work  Patient has been cleared to follow up on outpatient basis. Appointment scheduled at [facility] for 9/22 at 9am. Information placed on AVS as well.  [place], [place] [phone]

## 2014-12-26 NOTE — Evaluation (Signed)
Occupational Therapy Evaluation Patient Details Name: Roberta Hoover MRN: [id number] DOB: August 24, 1980 Today's Date: 12/26/2014    History of Present Illness pt was admitted for suicide attempt.  Pt has a h/o seizure and acute encephalopahthy   Clinical Impression   This 34 year old female was admitted for the above.  She was independent at baseline, with ADLs.  Currently she requires set up and min guard for ambulating. Pt reports that she is moving similiarly to baseline:  She appears very stiff and does not move head:  Central line still in place.  Recommend 24/7    Follow Up Recommendations  No OT follow up;Supervision/Assistance - 24 hour    Equipment Recommendations  None recommended by OT    Recommendations for Other Services       Precautions / Restrictions Precautions Precautions: Fall Restrictions Weight Bearing Restrictions: No      Mobility Bed Mobility                  Transfers Overall transfer level: Needs assistance Equipment used: None Transfers: Sit to/from Stand Sit to Stand: Modified independent (Device/Increase time)         General transfer comment: extra time but steady.  Min guard for safety when ambulating:  holds body stiffly    Balance                                            ADL Overall ADL's : Needs assistance/impaired                         Toilet Transfer: Min guard (for safety)             General ADL Comments: pt was dizzy when walking with RN earlier:  took 3 BPs.  Sitting 115/61; standing 103/59; standing after a couple of minutes 109 systolic.  No c/o dizziness.  Pt did describe spinning and this has happened only when standing.  She said that this is not a new problem.  Central line still in place:  pt did not move head side to side but was able to look up and down without difficulty.  Donned clothing with set up.  Discussed safety, especially if she feels dizzy, sitting if chair nearby  vs standing against wall to stabilize.  She does have a chair in her shower.     Vision     Perception     Praxis      Pertinent Vitals/Pain Pain Assessment: No/denies pain     Hand Dominance     Extremity/Trunk Assessment Upper Extremity Assessment Upper Extremity Assessment: Overall WFL for tasks assessed           Communication Communication Communication: Prefers language other than [place] ([place]; speaks [place])   Cognition Arousal/Alertness: Awake/alert Behavior During Therapy: WFL for tasks assessed/performed Overall Cognitive Status: Within Functional Limits for tasks assessed                     General Comments       Exercises       Shoulder Instructions      Home Living Family/patient expects to be discharged to:: Private residence Living Arrangements: Spouse/significant other                 Bathroom Shower/Tub: Tub/shower unit;Walk-in shower Shower/tub characteristics: Engineer, building services:  Standard     Home Equipment: Shower seat - built in          Prior Functioning/Environment Level of Independence: Independent             OT Diagnosis: Generalized weakness   OT Problem List:     OT Treatment/Interventions:      OT Goals(Current goals can be found in the care plan section) Acute Rehab OT Goals Patient Stated Goal: home  OT Frequency:     Barriers to D/C:            Co-evaluation              End of Session    Activity Tolerance: Patient tolerated treatment well Patient left: in chair;with call bell/phone within reach;with family/visitor present;with nursing/sitter in room   Time: 1435-1452 OT Time Calculation (min): 17 min Charges:  OT General Charges $OT Visit: 1 Procedure OT Evaluation $Initial OT Evaluation Tier I: 1 Procedure G-Codes:    Arzella Rehmann Jan 24, 2015, 3:06 PM Marica Otter, OTR/L [phone] 2015/01/24

## 2014-12-26 NOTE — Progress Notes (Signed)
SATURATION QUALIFICATIONS: (This note is used to comply with regulatory documentation for home oxygen)  Patient Saturations on Room Air at Rest = 100%  Patient Saturations on Room Air while Ambulating = 100%  Patient Saturations on Liters of oxygen while Ambulating = NA  Please briefly explain why patient needs home oxygen:

## 2014-12-27 LAB — CULTURE, BLOOD (ROUTINE X 2)
CULTURE: NO GROWTH
Culture: NO GROWTH

## 2015-01-16 ENCOUNTER — Encounter (HOSPITAL_COMMUNITY): Payer: Self-pay | Admitting: Psychology

## 2015-01-16 ENCOUNTER — Ambulatory Visit (INDEPENDENT_AMBULATORY_CARE_PROVIDER_SITE_OTHER): Payer: BLUE CROSS/BLUE SHIELD | Admitting: Psychology

## 2015-01-16 DIAGNOSIS — F4329 Adjustment disorder with other symptoms: Secondary | ICD-10-CM | POA: Diagnosis not present

## 2015-01-16 NOTE — Progress Notes (Signed)
Roberta Hoover is a 34 y.o. female patient who presents as referred by [facility] for f/u on 12/22/14 for suicide attempt.  Patient:   Roberta Hoover   DOB:   May 17, 1980  MR Number:  [id number]  Location:  [facility] BEHAVIORAL HEALTH OUTPATIENT THERAPY [place] [address] [id number] [place] [address] Dept: [phone]           Date of Service:   01/16/15  Start Time:   9.05am End Time:   9.43am  Sundiata Ferrick/Observer:  Clarene Essex LPC       Billing Code/Service: [phone]  Chief Complaint:     Chief Complaint  Patient presents with  . Follow-up    from ER    Reason for Service:  Pt reported that she is following up as recommended by hospital.  Pt reported that on 12/22/14 she and sister were arguing about her staying to live w/ them or returning to [place], she became very mad, took a small about of roach powder and then called 911 as began feeling very bad.  Pt reports no previous attempts for suicide, no SI and no anger or conflict since, no depression or anxiety.  Pt reported her parents came to stay for one week following and returned home as felt she was ok.  Pt reported that she and sister are getting along well.    Current Status:  Pt denies any psychiatric symptoms.  Pt denies any depressive symptoms.  Pt denies any anger or emotional lability.  Pt denies any anxiety.  Pt denies any sleep disturbance.  Pt denies any SI, no self harm, no thoughts of harming others.    Reliability of Information: Pt provided information.  Behavioral Observation: Paulene Tayag  presents as a 34 y.o.-year-old  Nepali Female who appeared her stated age. her dress was Appropriate and she was Well Groomed and her manners were Appropriate to the situation.  There were not any physical disabilities noted.  she displayed an appropriate level of cooperation and motivation.    Interactions:    Active   Attention:   within normal limits  Memory:   within normal  limits  Visuo-spatial:   not examined  Speech (Volume):  normal  Speech:   normal pitch and normal volume  Thought Process:  Coherent and Relevant  Though Content:  WNL  Orientation:   person, place, time/date and situation  Judgment:   Good  Planning:   Good  Affect:    Appropriate  Mood:    Euthymic  Insight:   Good  Intelligence:   normal  Marital Status/Living: Pt lives w/ her husband, 32 y/o daughter, 4y/o son in [place], [place].  Pt sister and her 11y/o son recently came to live with them.  Pt's was born in [place] and grew up in [place].  Pt came to the [place] in 2008.  Pt married her husband in 2009 and moved to [place] at that time.  Pt has 2 brothers and 5 sisters.  Her parents, 1 brother, 2 sisters live in [place].  1 sister lives in [place] and 1 brother in [place].  Pt reports visits parents about once a year.  Pt continues to practice her Hindu religion and reports that she continues her cultural traditions and also participates in some [place] traditions w/ her children (ie.  Christmas).    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    Substance Use:  No concerns of substance abuse are reported.  Pt denies any use of alcohol or drugs.  Education:   HS Graduate  Medical History:   Past Medical History  Diagnosis Date  . Epilepsy 2008        Outpatient Encounter Prescriptions as of 01/16/2015  Medication Sig  . levETIRAcetam (KEPPRA) 500 MG tablet Take 1 tablet (500 mg total) by mouth 2 (two) times daily.   No facility-administered encounter medications on file as of 01/16/2015.        Pt reports she is taking medications as prescribed and needs to set f/u w/ [facility] to manage meds.   Sexual History:   History  Sexual Activity  . Sexual Activity: Not on file    Abuse/Trauma  History: denies  Psychiatric History:  denies  Family Med/Psych History:  Family History  Problem Relation Age of Onset  . Family history unknown: Yes    Risk of Suicide/Violence: low pt denies any SI, no self harm since incident on 12/22/14 which she sought tx at ER herself.  Pt felt that poor impulse in the moment w/ anger. Pt reported no previous hx of SI and no current stressors either. Pt denies any current anger.    Impression/DX:  Pt is a 34 y/o female who presents as referred by [facility] to f/u from suicide attempt on 12/22/14.  Pt reports she hasn't had any hx of depressive, anxiety or mood instability.  Pt denies any current symptoms either of any mood disorders or psychosis.  Pt denies any SI or intent for harm to self.  Pt denies any stressors and reports conflict w/ sister resolved and they get along well.  Pt reports good support from family.  Pt expresses not looking for any mental health services.   Disposition/Plan:  Pt to f/u w/ [facility] for f/u of her seizure disorder. Pt agrees.  Pt reports not seeking any mental health services and feels good support from family.  Pt reports she will seek emergency services if in crisis again or any thoughts of SI.  Pt is aware of services in our clinic and may return if needed in future for another intake assessment.  No f/u scheduled at this time.   Diagnosis:     Adjustment disorder with emotional disturbance resolved.                  Forde Radon, LPC

## 2016-03-29 IMAGING — CT CT HEAD W/O CM
2 series · 16 of 30 positions shown, 20 images · non-contrast
Comparison: None.

CLINICAL DATA: Altered mental status.  Ingested poison.  Suicidal.

EXAM:
CT HEAD WITHOUT CONTRAST
TECHNIQUE: Contiguous axial images were obtained from the base of the skull
through the vertex without intravenous contrast.

[Series 2: head w/o · axial · non-contrast · 0.45mm/px · z∈[-19,+106]mm · 13 of 31 slices shown, 17 images]
[im 3/31  brain]
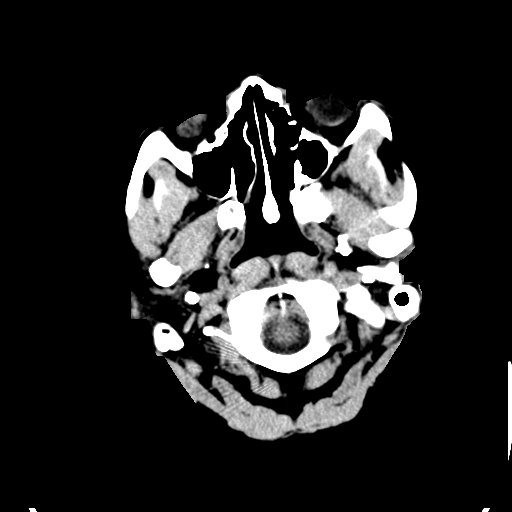
[im 3/31  bone]
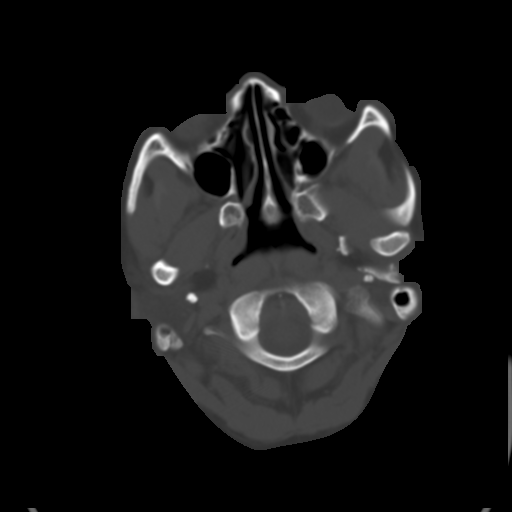
[im 5/31  brain]
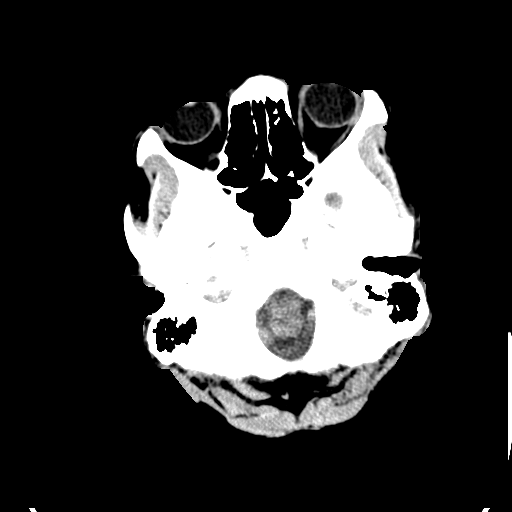
[im 7/31  brain]
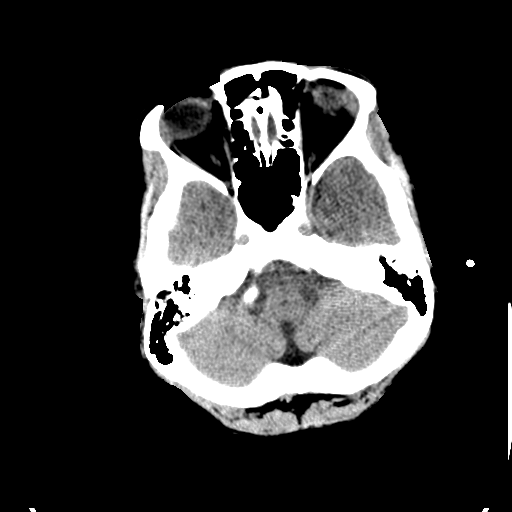
[im 9/31  brain]
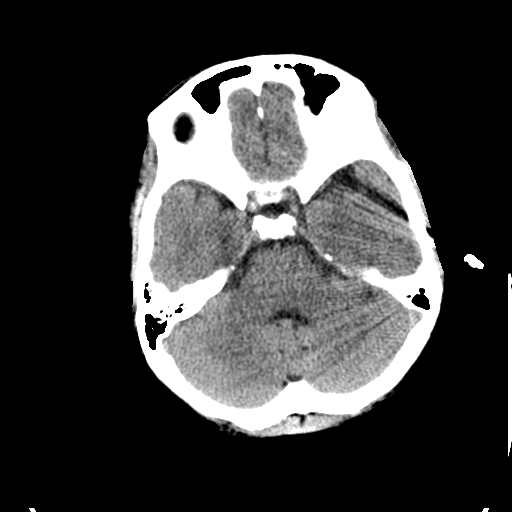
[im 11/31  brain]
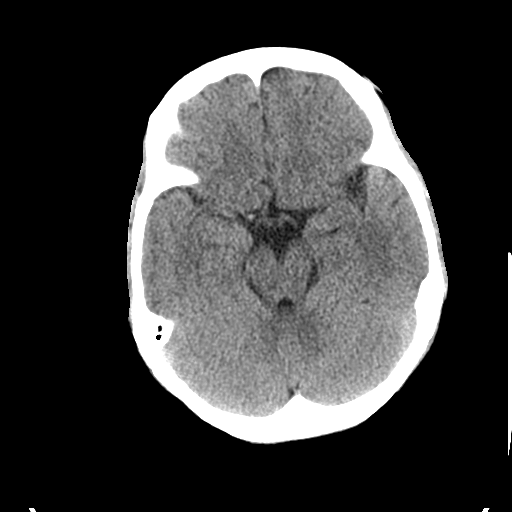
[im 11/31  bone]
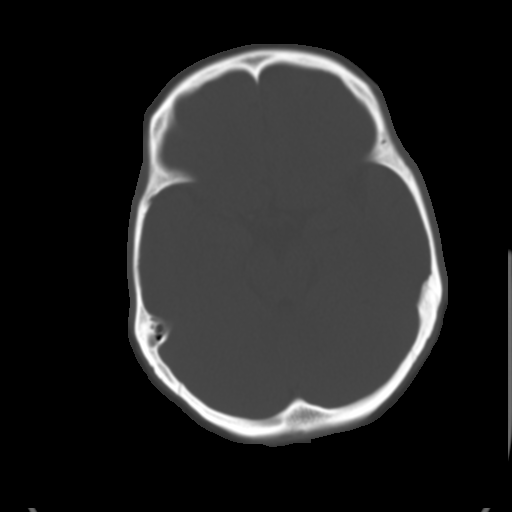
[im 13/31  brain]
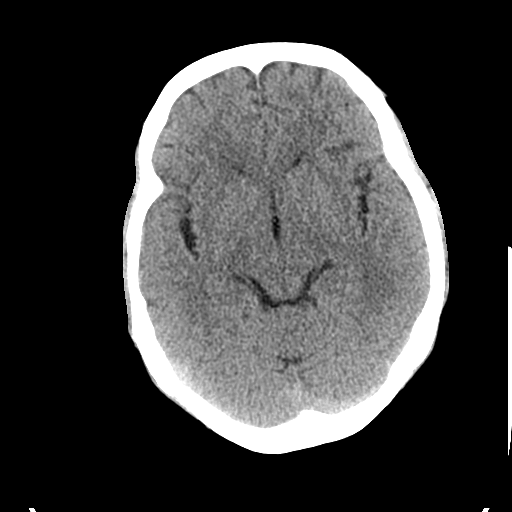
[im 16/31  brain]
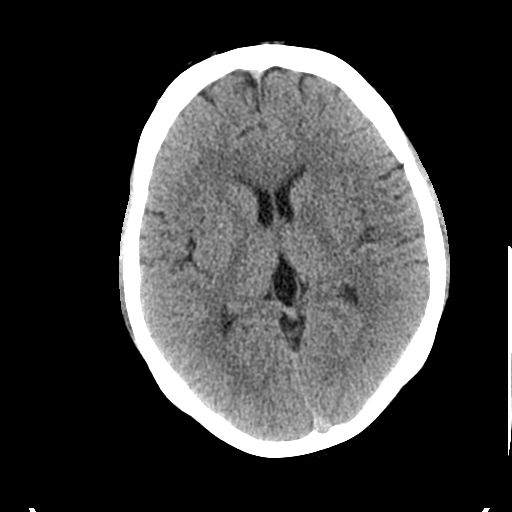
[im 18/31  brain]
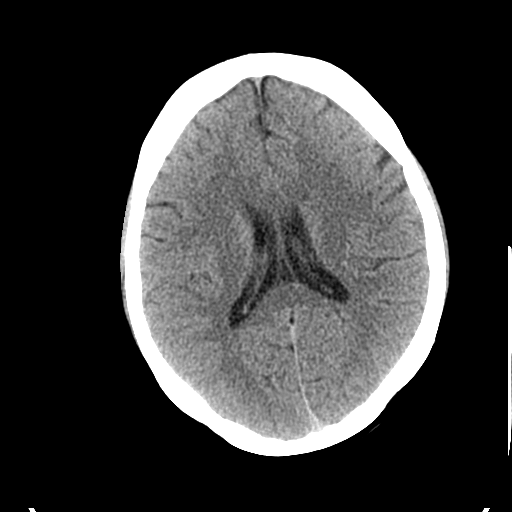
[im 20/31  brain]
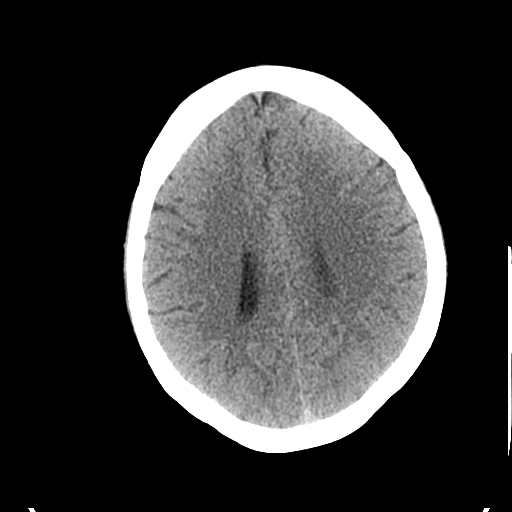
[im 20/31  bone]
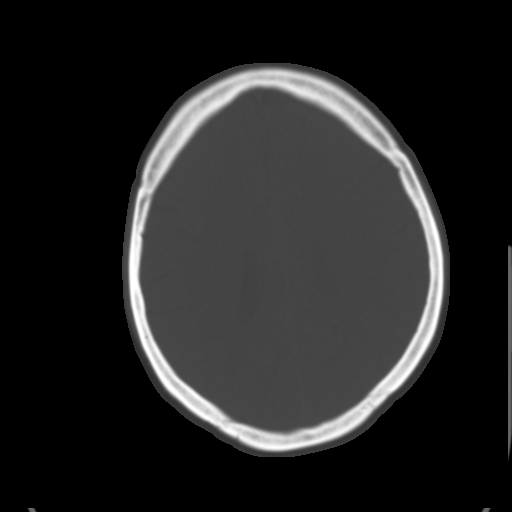
[im 22/31  brain]
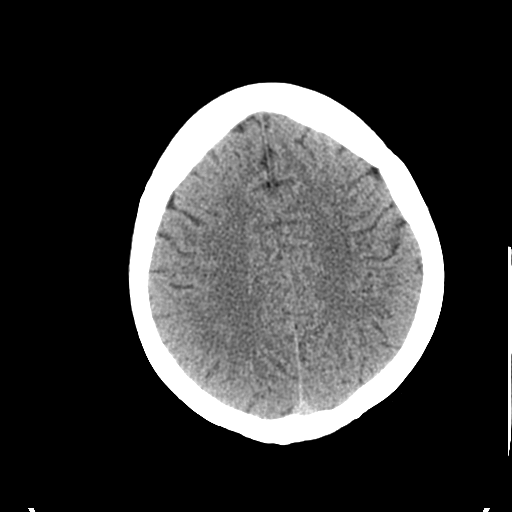
[im 24/31  brain]
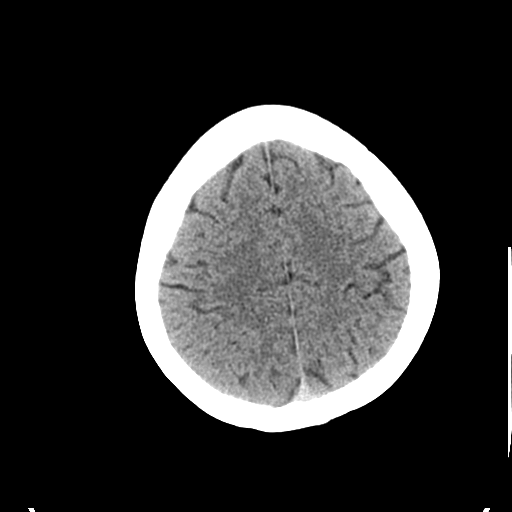
[im 26/31  brain]
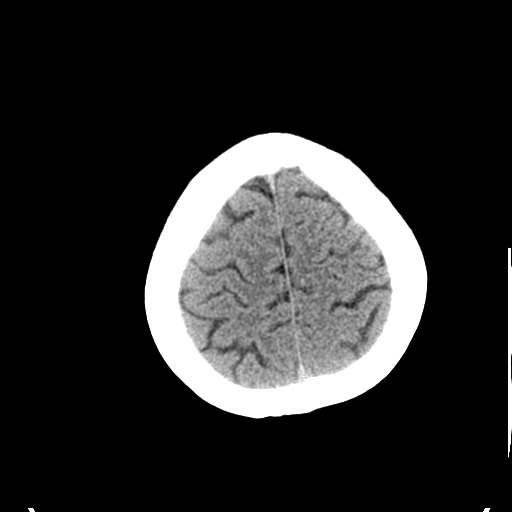
[im 28/31  brain]
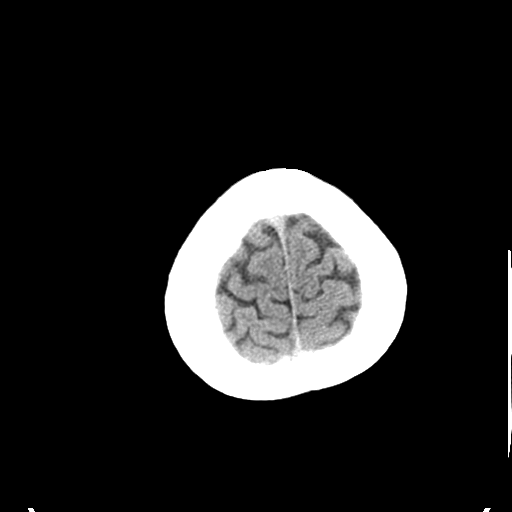
[im 28/31  bone]
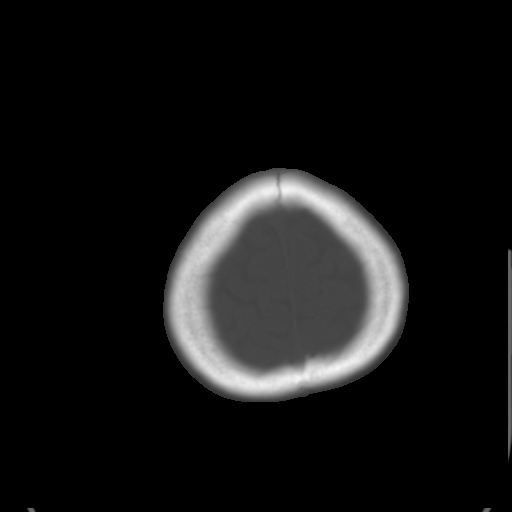

[Series 3: bone windows · axial · 0.45mm/px · z∈[-19,+21]mm · 3 of 31 slices shown]
[im 3/31  bone]
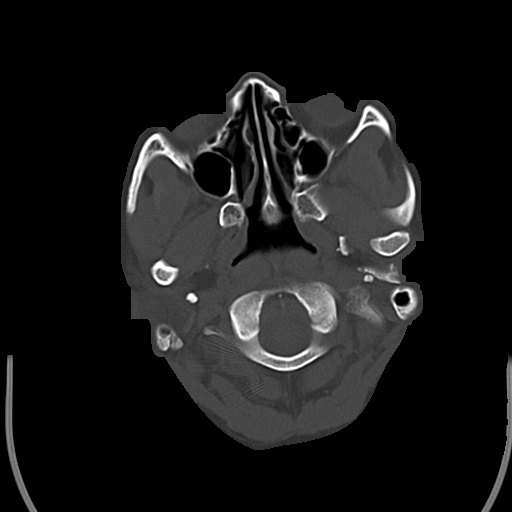
[im 7/31  bone]
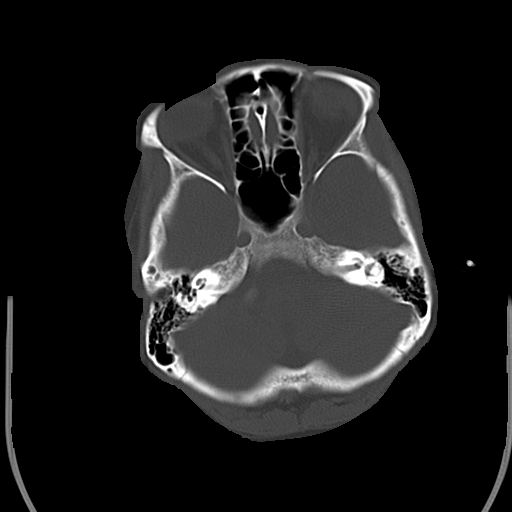
[im 11/31  bone]
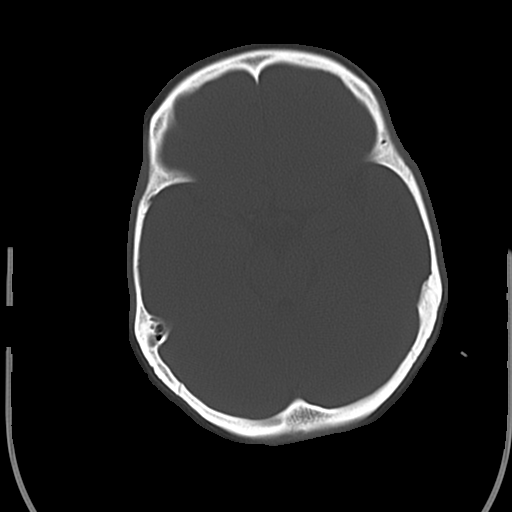

[16 of 30 positions shown; findings below may reference images not displayed]

FINDINGS: No acute intracranial abnormality. Specifically, no hemorrhage,
hydrocephalus, mass lesion, acute infarction, or significant
intracranial injury. No acute calvarial abnormality. Visualized
paranasal sinuses and mastoids clear. Orbital soft tissues
unremarkable.
IMPRESSION: Normal study.

## 2016-03-29 IMAGING — DX DG CHEST 1V PORT
1 series · 1 of 1 positions shown · non-contrast
Comparison: Portable exam 1646 hours compared to 01/17/2008

CLINICAL DATA: Overdose on LSD, shortness of breath question toxic
congestion

EXAM:
PORTABLE CHEST - 1 VIEW

[chest ap]
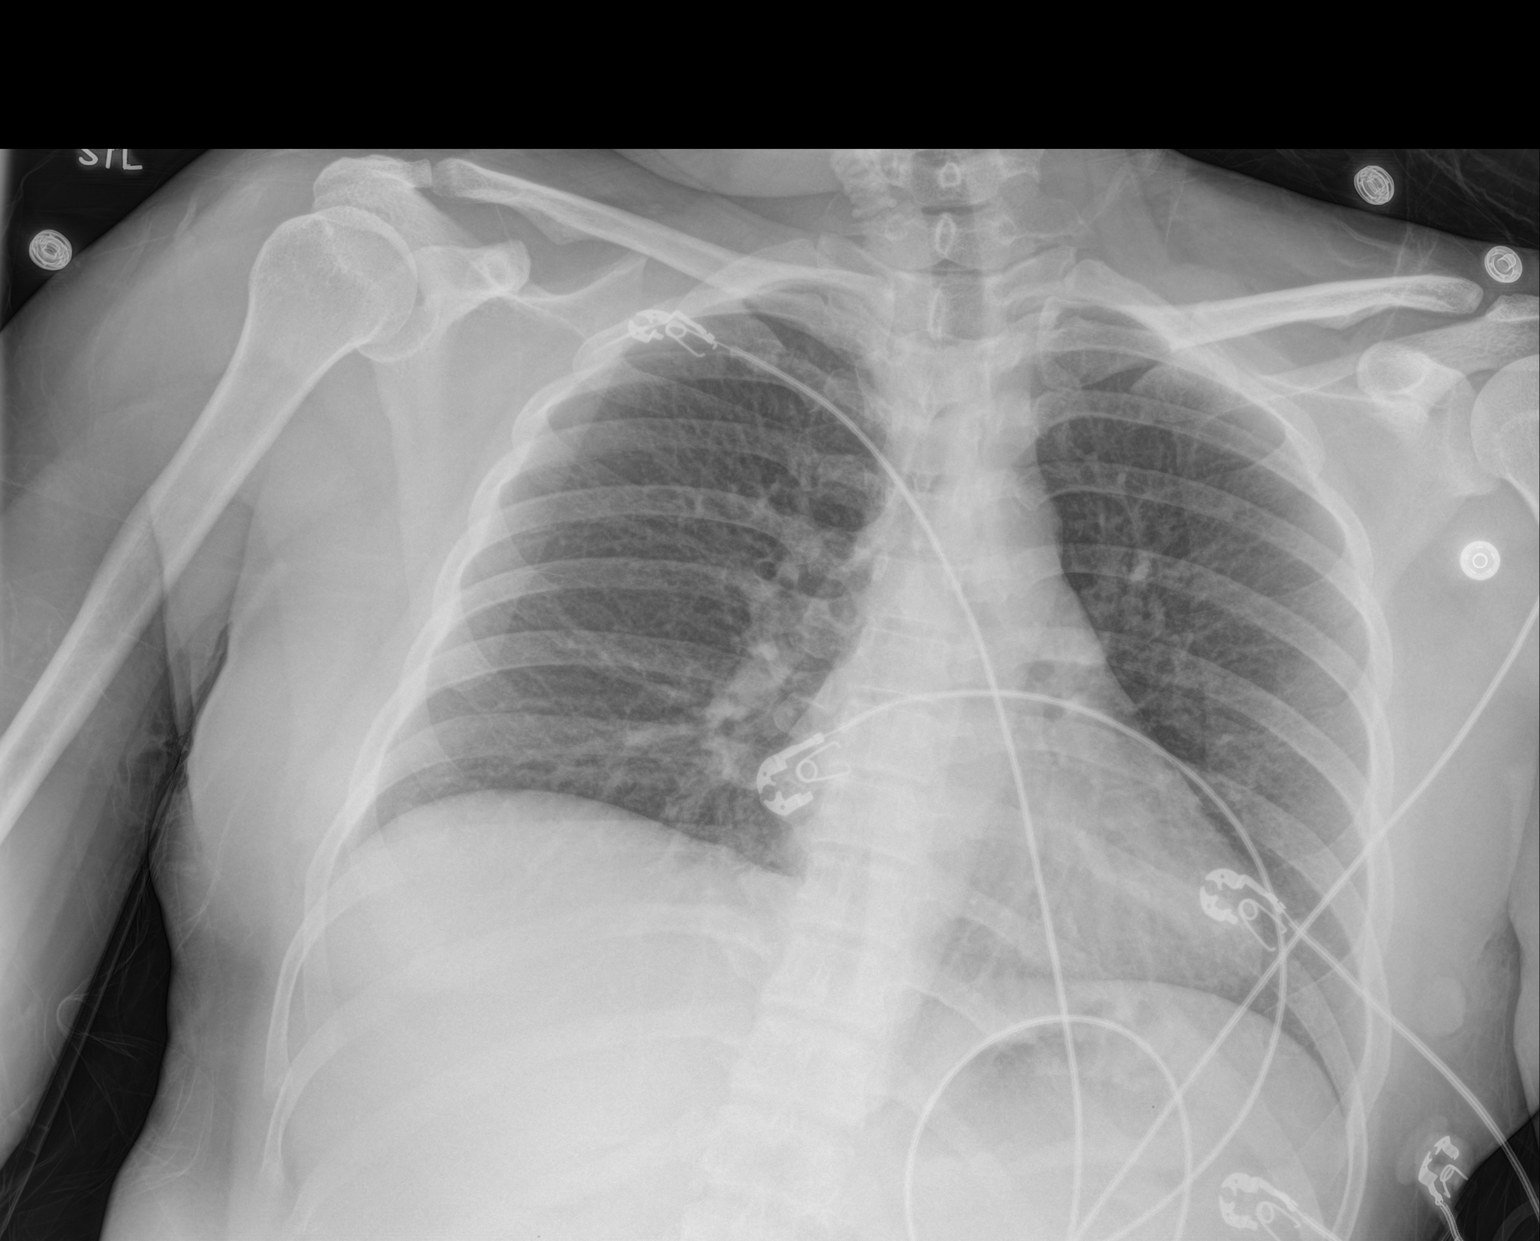

[1 of 1 positions shown; findings below may reference images not displayed]

FINDINGS: Normal heart size, mediastinal contours, and pulmonary vascularity.

Lungs clear.

No pleural effusion or pneumothorax.

Bones unremarkable.
IMPRESSION: No acute abnormalities.

## 2016-08-31 ENCOUNTER — Ambulatory Visit (HOSPITAL_COMMUNITY)
Admission: EM | Admit: 2016-08-31 | Discharge: 2016-08-31 | Disposition: A | Discharge status: Home / Self Care | Payer: BLUE CROSS/BLUE SHIELD | Attending: Emergency Medicine | Admitting: Emergency Medicine

## 2016-08-31 ENCOUNTER — Encounter (HOSPITAL_COMMUNITY): Payer: Self-pay | Admitting: Emergency Medicine

## 2016-08-31 DIAGNOSIS — K648 Other hemorrhoids: Secondary | ICD-10-CM | POA: Diagnosis not present

## 2016-08-31 DIAGNOSIS — J309 Allergic rhinitis, unspecified: Secondary | ICD-10-CM | POA: Diagnosis not present

## 2016-08-31 DIAGNOSIS — J301 Allergic rhinitis due to pollen: Secondary | ICD-10-CM

## 2016-08-31 DIAGNOSIS — K644 Residual hemorrhoidal skin tags: Secondary | ICD-10-CM

## 2016-08-31 MED ORDER — STARCH 51 % RE SUPP: 1.0000 | RECTAL | 0 refills | Status: AC | PRN

## 2016-08-31 NOTE — ED Triage Notes (Signed)
[facility] with a complaint of eye drainage that she believed to be allergies and a rectal abscess.

## 2016-08-31 NOTE — ED Provider Notes (Signed)
CSN: [id number]     Arrival date & time 08/31/16  1821 History   None    Chief Complaint  Patient presents with  . Eye Drainage  . Abscess   (Consider location/radiation/quality/duration/timing/severity/associated sxs/prior Treatment) 36 yr old female presents to UC with cc of itchy watery eyes for a few days, nasal congestion, sneezing and rectal abscess for 3 years. Pt has not tried any treatments PTA.    The history is provided by the patient. No language interpreter was used.    Past Medical History:  Diagnosis Date  . Epilepsy (HCC) 2008   Past Surgical History:  Procedure Laterality Date  . CESAREAN SECTION     Family History  Problem Relation Age of Onset  . Family history unknown: Yes   Social History  Substance Use Topics  . Smoking status: Never Smoker  . Smokeless tobacco: Not on file  . Alcohol use No   OB History    No data available     Review of Systems  Constitutional: Negative for fever.  HENT: Positive for congestion, sinus pressure and sneezing.   Eyes: Positive for discharge and redness.  Gastrointestinal: Positive for rectal pain.  All other systems reviewed and are negative.   Allergies  Patient has no known allergies.  Home Medications   Prior to Admission medications   Medication Sig Start Date End Date Taking? Authorizing Provider  starch (ANUSOL) 51 % suppository Place 1 suppository rectally as needed for pain. 08/31/16   Tequan Redmon, Para MarchJeanette, NP   Meds Ordered and Administered this Visit  Medications - No data to display  BP (!) 101/58 (BP Location: Right Arm)   Pulse 64   Temp 98.1 F (36.7 C) (Oral)   Resp 16   SpO2 99%  No data found.   Physical Exam  Constitutional: She is oriented to person, place, and time. She appears well-developed. She is active and cooperative.  HENT:  Head: Normocephalic.  Nose: Mucosal edema present. Right sinus exhibits no maxillary sinus tenderness. Left sinus exhibits no maxillary sinus  tenderness.  Eyes: Conjunctivae, EOM and lids are normal. Pupils are equal, round, and reactive to light.  Neck: No tracheal deviation present.  Cardiovascular: Normal rate.   Pulmonary/Chest: Effort normal.  Genitourinary: Rectal exam shows external hemorrhoid and tenderness. Pelvic exam was performed with patient prone.    Genitourinary Comments: Exam chaperoned with tech at bedside  Musculoskeletal: Normal range of motion.  Neurological: She is alert and oriented to person, place, and time. GCS eye subscore is 4. GCS verbal subscore is 5. GCS motor subscore is 6.  Skin: Skin is warm and dry.  Psychiatric: She has a normal mood and affect. Her behavior is normal. Judgment and thought content normal.  Nursing note and vitals reviewed.   Urgent Care Course     Procedures (including critical care time)  Labs Review Labs Reviewed - No data to display  Imaging Review No results found.      MDM   1. Seasonal allergic rhinitis due to pollen   2. External hemorrhoid     Take OTC allergy med of choice. Increase fiber and water in diet. Anusol suppositories as directed. Follow up with PCP of your choice. Return to UC as needed. Pt verbalized understanding to this provider.    Clancy Gourdefelice, Quinn Bartling, NP 09/03/16 2047

## 2016-08-31 NOTE — Discharge Instructions (Addendum)
Take OTC allergy med of choice. Increase fiber and water in diet. Follow up with PCP of your choice. Return to UC as needed.
